# Patient Record
Sex: Female | Born: 1960
Health system: Southern US, Community
[De-identification: ages and names within clinical notes are randomized; demographics above are authoritative.]

## PROBLEM LIST (undated history)

## (undated) DIAGNOSIS — T7840XA Allergy, unspecified, initial encounter: Secondary | ICD-10-CM

## (undated) DIAGNOSIS — F419 Anxiety disorder, unspecified: Secondary | ICD-10-CM

## (undated) DIAGNOSIS — I10 Essential (primary) hypertension: Secondary | ICD-10-CM

## (undated) DIAGNOSIS — H269 Unspecified cataract: Secondary | ICD-10-CM

## (undated) HISTORY — DX: Allergy, unspecified, initial encounter: T78.40XA

## (undated) HISTORY — DX: Unspecified cataract: H26.9

## (undated) HISTORY — DX: Essential (primary) hypertension: I10

## (undated) HISTORY — PX: KIDNEY SURGERY: SHX687

## (undated) HISTORY — PX: BREAST CYST EXCISION: SHX579

## (undated) HISTORY — PX: TUBAL LIGATION: SHX77

## (undated) HISTORY — PX: BREAST EXCISIONAL BIOPSY: SUR124

## (undated) HISTORY — PX: CORNEAL TRANSPLANT: SHX108

---

## 1999-05-30 ENCOUNTER — Emergency Department (HOSPITAL_COMMUNITY): Admission: EM | Admit: 1999-05-30 | Discharge: 1999-05-30 | Payer: Self-pay | Admitting: Internal Medicine

## 1999-05-30 ENCOUNTER — Encounter: Payer: Self-pay | Admitting: Internal Medicine

## 2003-05-07 ENCOUNTER — Emergency Department (HOSPITAL_COMMUNITY): Admission: EM | Admit: 2003-05-07 | Discharge: 2003-05-07 | Payer: Self-pay | Admitting: Emergency Medicine

## 2003-08-19 ENCOUNTER — Encounter: Admission: RE | Admit: 2003-08-19 | Discharge: 2003-08-19 | Payer: Self-pay | Admitting: Family Medicine

## 2004-02-12 ENCOUNTER — Emergency Department (HOSPITAL_COMMUNITY): Admission: EM | Admit: 2004-02-12 | Discharge: 2004-02-12 | Payer: Self-pay | Admitting: Emergency Medicine

## 2004-12-29 ENCOUNTER — Encounter: Admission: RE | Admit: 2004-12-29 | Discharge: 2004-12-29 | Payer: Self-pay | Admitting: Internal Medicine

## 2005-01-10 ENCOUNTER — Encounter (INDEPENDENT_AMBULATORY_CARE_PROVIDER_SITE_OTHER): Payer: Self-pay | Admitting: Specialist

## 2005-01-10 ENCOUNTER — Ambulatory Visit (HOSPITAL_BASED_OUTPATIENT_CLINIC_OR_DEPARTMENT_OTHER): Admission: RE | Admit: 2005-01-10 | Discharge: 2005-01-10 | Payer: Self-pay | Admitting: General Surgery

## 2005-01-10 ENCOUNTER — Ambulatory Visit (HOSPITAL_COMMUNITY): Admission: RE | Admit: 2005-01-10 | Discharge: 2005-01-10 | Payer: Self-pay | Admitting: General Surgery

## 2007-01-19 ENCOUNTER — Emergency Department (HOSPITAL_COMMUNITY): Admission: EM | Admit: 2007-01-19 | Discharge: 2007-01-19 | Payer: Self-pay | Admitting: Emergency Medicine

## 2007-01-22 ENCOUNTER — Emergency Department (HOSPITAL_COMMUNITY): Admission: EM | Admit: 2007-01-22 | Discharge: 2007-01-22 | Payer: Self-pay | Admitting: Emergency Medicine

## 2008-06-14 ENCOUNTER — Emergency Department (HOSPITAL_COMMUNITY): Admission: EM | Admit: 2008-06-14 | Discharge: 2008-06-14 | Payer: Self-pay | Admitting: Emergency Medicine

## 2009-10-04 ENCOUNTER — Emergency Department (HOSPITAL_COMMUNITY): Admission: EM | Admit: 2009-10-04 | Discharge: 2009-10-04 | Payer: Self-pay | Admitting: Emergency Medicine

## 2010-07-31 LAB — URINALYSIS, ROUTINE W REFLEX MICROSCOPIC
Bilirubin Urine: NEGATIVE
Glucose, UA: NEGATIVE mg/dL
Ketones, ur: NEGATIVE mg/dL
Nitrite: NEGATIVE
Protein, ur: 30 mg/dL — AB
Specific Gravity, Urine: 1.03 (ref 1.005–1.030)
Urobilinogen, UA: 0.2 mg/dL (ref 0.0–1.0)
pH: 5.5 (ref 5.0–8.0)

## 2010-07-31 LAB — URINE MICROSCOPIC-ADD ON

## 2010-07-31 LAB — WET PREP, GENITAL: Yeast Wet Prep HPF POC: NONE SEEN

## 2010-07-31 LAB — POCT PREGNANCY, URINE: Preg Test, Ur: NEGATIVE

## 2010-07-31 LAB — GC/CHLAMYDIA PROBE AMP, GENITAL: Chlamydia, DNA Probe: NEGATIVE

## 2010-08-29 LAB — URINALYSIS, ROUTINE W REFLEX MICROSCOPIC
Glucose, UA: NEGATIVE mg/dL
pH: 5.5 (ref 5.0–8.0)

## 2010-08-29 LAB — COMPREHENSIVE METABOLIC PANEL
ALT: 30 U/L (ref 0–35)
Alkaline Phosphatase: 66 U/L (ref 39–117)
Calcium: 9.4 mg/dL (ref 8.4–10.5)
Chloride: 102 mEq/L (ref 96–112)
Creatinine, Ser: 0.95 mg/dL (ref 0.4–1.2)
GFR calc Af Amer: >60 mL/min (ref >60)
Potassium: 3.9 mEq/L (ref 3.5–5.1)
Total Protein: 7.4 g/dL (ref 6.0–8.3)

## 2010-08-29 LAB — CBC
HCT: 29.7 % — ABNORMAL LOW (ref 36.0–46.0)
MCV: 73.2 fL — ABNORMAL LOW (ref 78.0–100.0)
Platelets: 502 10*3/uL — ABNORMAL HIGH (ref 150–400)
RBC: 4.06 MIL/uL (ref 3.87–5.11)

## 2010-08-29 LAB — DIFFERENTIAL
Eosinophils Absolute: 0.1 10*3/uL (ref 0.0–0.7)
Monocytes Absolute: 0.6 10*3/uL (ref 0.1–1.0)

## 2010-08-29 LAB — HEMOCCULT GUIAC POC 1CARD (OFFICE): Fecal Occult Bld: NEGATIVE

## 2010-09-29 NOTE — Op Note (Signed)
NAMECHRISTOL, Candace Hall            ACCOUNT NO.:  [id number]   MEDICAL RECORD NO.:  [id number]          PATIENT TYPE:  AMB   LOCATION:  DSC                          FACILITY:  [facility]   PHYSICIAN:  Ollen Gross. Vernell Morgans, M.D. DATE OF BIRTH:  December 18, 1960   DATE OF PROCEDURE:  01/10/2005  DATE OF DISCHARGE:                                 OPERATIVE REPORT   PREOPERATIVE DIAGNOSIS:  Right breast sebaceous cyst.   POSTOPERATIVE DIAGNOSIS:  Right breast sebaceous cyst.   PROCEDURES:  Excision of right breast sebaceous cyst.   SURGEON:  Ollen Gross. Carolynne Edouard, M.D.   ANESTHESIA:  General via LMA.   PROCEDURE:  After informed consent was obtained, the patient brought to the  operating room and placed in supine position on the table. After induction  of general anesthesia, the patient's right breast was prepped with Betadine  and draped in usual sterile manner. An elliptical incision was made with a  15 blade knife around the mass in question. This incision was carried down  through the skin and subcutaneous tissue sharply with the 15 blade knife  until the mass itself was completely excised. The mass was then sent to  pathology for further evaluation. Hemostasis was achieved using Bovie  electrocautery. The wound was infiltrated with 0.25% Marcaine. The incision  was then closed with interrupted 4-0 Monocryl subcuticular stitches.  Benzoin, Steri-Strips and sterile dressings were applied. The patient  tolerated the procedure well and at the end of the case, all needle, sponge  and instrument counts correct. The patient was awake and taken recovery in  stable condition.      Ollen Gross. Vernell Morgans, M.D.  Electronically Signed     PST/MEDQ  D:  01/10/2005  T:  01/11/2005  Job:  [id number]

## 2011-02-23 LAB — URINALYSIS, ROUTINE W REFLEX MICROSCOPIC
Glucose, UA: NEGATIVE
Nitrite: NEGATIVE

## 2011-02-23 LAB — PREGNANCY, URINE: Preg Test, Ur: NEGATIVE

## 2012-05-11 ENCOUNTER — Encounter (HOSPITAL_COMMUNITY): Payer: Self-pay | Admitting: Emergency Medicine

## 2012-05-11 ENCOUNTER — Emergency Department (HOSPITAL_COMMUNITY)
Admission: EM | Admit: 2012-05-11 | Discharge: 2012-05-11 | Disposition: A | Discharge status: Home / Self Care | Payer: 59 | Attending: Emergency Medicine | Admitting: Emergency Medicine

## 2012-05-11 ENCOUNTER — Emergency Department (HOSPITAL_COMMUNITY): Payer: 59

## 2012-05-11 DIAGNOSIS — M719 Bursopathy, unspecified: Secondary | ICD-10-CM | POA: Insufficient documentation

## 2012-05-11 DIAGNOSIS — M67919 Unspecified disorder of synovium and tendon, unspecified shoulder: Secondary | ICD-10-CM | POA: Insufficient documentation

## 2012-05-11 DIAGNOSIS — M7580 Other shoulder lesions, unspecified shoulder: Secondary | ICD-10-CM

## 2012-05-11 DIAGNOSIS — M2550 Pain in unspecified joint: Secondary | ICD-10-CM | POA: Insufficient documentation

## 2012-05-11 DIAGNOSIS — R0602 Shortness of breath: Secondary | ICD-10-CM | POA: Insufficient documentation

## 2012-05-11 DIAGNOSIS — R0789 Other chest pain: Secondary | ICD-10-CM | POA: Insufficient documentation

## 2012-05-11 DIAGNOSIS — M25519 Pain in unspecified shoulder: Secondary | ICD-10-CM | POA: Insufficient documentation

## 2012-05-11 DIAGNOSIS — M25512 Pain in left shoulder: Secondary | ICD-10-CM

## 2012-05-11 LAB — CBC WITH DIFFERENTIAL/PLATELET
Eosinophils Absolute: 0.1 10*3/uL (ref 0.0–0.7)
Eosinophils Relative: 2 % (ref 0–5)
Hemoglobin: 11.2 g/dL — ABNORMAL LOW (ref 12.0–15.0)
Lymphocytes Relative: 40 % (ref 12–46)
MCH: 24.4 pg — ABNORMAL LOW (ref 26.0–34.0)
MCHC: 32.7 g/dL (ref 30.0–36.0)
Monocytes Absolute: 0.5 10*3/uL (ref 0.1–1.0)
RBC: 4.59 MIL/uL (ref 3.87–5.11)
RDW: 16.3 % — ABNORMAL HIGH (ref 11.5–15.5)
WBC: 7.1 10*3/uL (ref 4.0–10.5)

## 2012-05-11 LAB — BASIC METABOLIC PANEL
Creatinine, Ser: 0.93 mg/dL (ref 0.50–1.10)
GFR calc Af Amer: 82 mL/min — ABNORMAL LOW (ref >90)
GFR calc non Af Amer: 70 mL/min — ABNORMAL LOW (ref >90)
Potassium: 3.7 mEq/L (ref 3.5–5.1)

## 2012-05-11 MED ORDER — TRAMADOL HCL 50 MG PO TABS
50.0000 mg | ORAL_TABLET | Freq: Once | ORAL | Status: DC
  Filled 2012-05-11: qty 1

## 2012-05-11 MED ORDER — IBUPROFEN 200 MG PO TABS
ORAL_TABLET | ORAL | Status: AC
  Administered 2012-05-11: 200 mg
  Filled 2012-05-11: qty 1

## 2012-05-11 MED ORDER — TRAMADOL HCL 50 MG PO TABS: 50.0000 mg | ORAL_TABLET | Freq: Four times a day (QID) | ORAL | Status: DC | PRN

## 2012-05-11 MED ORDER — IBUPROFEN 800 MG PO TABS
800.0000 mg | ORAL_TABLET | Freq: Once | ORAL | Status: DC
  Filled 2012-05-11: qty 1

## 2012-05-11 MED ORDER — IBUPROFEN 400 MG PO TABS: 200.0000 mg | ORAL_TABLET | Freq: Four times a day (QID) | ORAL | Status: DC | PRN

## 2012-05-11 NOTE — ED Notes (Signed)
Patient transported to X-ray 

## 2012-05-11 NOTE — ED Provider Notes (Signed)
Medical screening examination/treatment/procedure(s) were performed by non-physician practitioner and as supervising physician I was immediately available for consultation/collaboration.   Charles B. Bernette Mayers, MD 05/11/12 1517

## 2012-05-11 NOTE — ED Notes (Signed)
Patent states that she has pain to her left shoulder up into the left side of her neck. The patient also reports that she has HA - the patient is HTN in triage with no history of such.

## 2012-05-11 NOTE — ED Provider Notes (Signed)
History     CSN: [id number]  Arrival date & time 05/11/12  1226   First MD Initiated Contact with Patient 05/11/12 1253      Chief Complaint  Patient presents with  . Headache  . Shoulder Pain    (Consider location/radiation/quality/duration/timing/severity/associated sxs/prior treatment) HPI Comments: Patient presents with left shoulder pain and neck pain since Tuesday. Associated symptoms include left chest pain and SOB. Of note patient has a history of untreated anxiety and frequent panic attacks. Last attach was 2 weeks ago.  Denies fever or chills. Denies NVD or abdominal pain. Denies injury or trauma.   The history is provided by the patient. No language interpreter was used.    History reviewed. No pertinent past medical history.  Past Surgical History  Procedure Date  . Kidney surgery     No family history on file.  History  Substance Use Topics  . Smoking status: Never Smoker   . Smokeless tobacco: Not on file  . Alcohol Use: No    OB History    Grav Para Term Preterm Abortions TAB SAB Ect Mult Living                  Review of Systems  Constitutional: Negative for fever and chills.  Respiratory: Positive for chest tightness and shortness of breath.   Gastrointestinal: Negative for nausea, vomiting, abdominal pain and diarrhea.  Musculoskeletal: Positive for arthralgias.    Allergies  Penicillins  Home Medications   Current Outpatient Rx  Name  Route  Sig  Dispense  Refill  . BC HEADACHE POWDER PO   Oral   Take 1 packet by mouth every 6 (six) hours as needed. For pain           BP 165/95  Pulse 94  Temp 98.2 F (36.8 C) (Oral)  Resp 20  SpO2 98%  LMP 04/20/2012  Physical Exam  Nursing note and vitals reviewed. Constitutional: She appears well-developed and well-nourished.  HENT:  Head: Normocephalic and atraumatic.  Mouth/Throat: Oropharynx is clear and moist.  Eyes: Conjunctivae normal and EOM are normal. No scleral icterus.    Neck: Normal range of motion. Neck supple.  Cardiovascular: Normal rate, regular rhythm and normal heart sounds.   Pulmonary/Chest: Effort normal and breath sounds normal.  Abdominal: Soft. Bowel sounds are normal. There is no tenderness.  Musculoskeletal:       Arms: Neurological: She is alert.  Skin: Skin is warm.    ED Course  Procedures (including critical care time) Results for orders placed during the hospital encounter of 05/11/12  CBC WITH DIFFERENTIAL      Component Value Range   WBC 7.1  4.0 - 10.5 K/uL   RBC 4.59  3.87 - 5.11 MIL/uL   Hemoglobin 11.2 (*) 12.0 - 15.0 g/dL   HCT 91.4 (*) 78.2 - 95.6 %   MCV 74.5 (*) 78.0 - 100.0 fL   MCH 24.4 (*) 26.0 - 34.0 pg   MCHC 32.7  30.0 - 36.0 g/dL   RDW 21.3 (*) 08.6 - 57.8 %   Platelets 383  150 - 400 K/uL   Neutrophils Relative 51  43 - 77 %   Neutro Abs 3.6  1.7 - 7.7 K/uL   Lymphocytes Relative 40  12 - 46 %   Lymphs Abs 2.9  0.7 - 4.0 K/uL   Monocytes Relative 7  3 - 12 %   Monocytes Absolute 0.5  0.1 - 1.0 K/uL   Eosinophils Relative 2  0 - 5 %   Eosinophils Absolute 0.1  0.0 - 0.7 K/uL   Basophils Relative 0  0 - 1 %   Basophils Absolute 0.0  0.0 - 0.1 K/uL  BASIC METABOLIC PANEL      Component Value Range   Sodium 138  135 - 145 mEq/L   Potassium 3.7  3.5 - 5.1 mEq/L   Chloride 106  96 - 112 mEq/L   CO2 26  19 - 32 mEq/L   Glucose, Bld 117 (*) 70 - 99 mg/dL   BUN 10  6 - 23 mg/dL   Creatinine, Ser 1.61  0.50 - 1.10 mg/dL   Calcium 9.0  8.4 - 09.6 mg/dL   GFR calc non Af Amer 70 (*) >90 mL/min   GFR calc Af Amer 82 (*) >90 mL/min  TROPONIN I      Component Value Range   Troponin I <0.30  <0.30 ng/mL    Labs Reviewed - No data to display Dg Chest 2 View  05/11/2012  *RADIOLOGY REPORT*  Clinical Data: Headache and shoulder pain.  CHEST - 2 VIEW  Comparison: None.  Findings: Cardiomediastinal silhouette is within normal limits. The lungs are free of focal consolidations and pleural effusions. There are  degenerative changes in the mid thoracic spine.  Surgical clips are present in the left upper quadrant of the abdomen.  IMPRESSION: No evidence for acute cardiopulmonary abnormality.   Original Report Authenticated By: Norva Pavlov, M.D.    Dg Shoulder Left  05/11/2012  *RADIOLOGY REPORT*  Clinical Data: Headache and shoulder pain.  No known injury. Symptoms for 5 days.  LEFT SHOULDER - 2+ VIEW  Comparison: Chest x-ray 01/19/2007  Findings: There is no evidence for acute fracture or dislocation. No soft tissue foreign body or gas identified.  Left lung apex is unremarkable in appearance.  IMPRESSION: Negative exam.   Original Report Authenticated By: Norva Pavlov, M.D.    Date: 05/11/2012  Rate: 62  Rhythm: normal sinus rhythm  QRS Axis: normal  Intervals: normal  ST/T Wave abnormalities: normal  Conduction Disutrbances:none  Narrative Interpretation: No STEMI  Old EKG Reviewed: none available     1. Rotator cuff tendinitis   2. Left shoulder pain       MDM  Patient presented with left shoulder pain, chest tightness and SOB. EKG, CXR and troponin: unremarkable. Labs unremarkable. Shoulder imaging unremarkable.  Patient did not want pain medication. Became anxious. Given history and physical high suspicion for rotator cuff tendinitis. Referral to primary care given to address possible HTN. Patient instructed to establish primary care and have blood pressure recheck in 3 weeks. Also referred to physical therapy. Given Rx for Ibuprofen and tramadol. Return precautions given. No red flags for septic arthritis.        Pixie Casino, PA-C 05/11/12 1512

## 2012-05-11 NOTE — ED Notes (Signed)
Reports woke up Tuesday morning with left shoulder hurting, denies any trauma rated 10/10 now 8/10 certain  movements increase pain in neck. Also, woke up this am with a headache/in face/jaw/ teeth are aching due to headache. Patient has a headache everyday for 4-6 months. Treats headaches with BC powder last dose was Tues when she woke up.

## 2013-06-28 ENCOUNTER — Encounter (HOSPITAL_COMMUNITY): Payer: Self-pay | Admitting: Emergency Medicine

## 2013-06-28 ENCOUNTER — Emergency Department (HOSPITAL_COMMUNITY): Payer: 59

## 2013-06-28 ENCOUNTER — Emergency Department (HOSPITAL_COMMUNITY)
Admission: EM | Admit: 2013-06-28 | Discharge: 2013-06-28 | Disposition: A | Discharge status: Home / Self Care | Payer: 59 | Attending: Emergency Medicine | Admitting: Emergency Medicine

## 2013-06-28 DIAGNOSIS — R072 Precordial pain: Secondary | ICD-10-CM | POA: Insufficient documentation

## 2013-06-28 DIAGNOSIS — R0602 Shortness of breath: Secondary | ICD-10-CM | POA: Insufficient documentation

## 2013-06-28 DIAGNOSIS — Z8659 Personal history of other mental and behavioral disorders: Secondary | ICD-10-CM | POA: Insufficient documentation

## 2013-06-28 DIAGNOSIS — Z880 Allergy status to penicillin: Secondary | ICD-10-CM | POA: Insufficient documentation

## 2013-06-28 DIAGNOSIS — M549 Dorsalgia, unspecified: Secondary | ICD-10-CM | POA: Insufficient documentation

## 2013-06-28 DIAGNOSIS — R079 Chest pain, unspecified: Secondary | ICD-10-CM

## 2013-06-28 HISTORY — DX: Anxiety disorder, unspecified: F41.9

## 2013-06-28 LAB — CBC WITH DIFFERENTIAL/PLATELET
BASOS PCT: 0 % (ref 0–1)
Basophils Absolute: 0 10*3/uL (ref 0.0–0.1)
EOS PCT: 1 % (ref 0–5)
Eosinophils Absolute: 0.1 10*3/uL (ref 0.0–0.7)
HEMATOCRIT: 33.5 % — AB (ref 36.0–46.0)
HEMOGLOBIN: 11.7 g/dL — AB (ref 12.0–15.0)
LYMPHS PCT: 35 % (ref 12–46)
Lymphs Abs: 2.7 10*3/uL (ref 0.7–4.0)
MCH: 29.8 pg (ref 26.0–34.0)
MCHC: 34.9 g/dL (ref 30.0–36.0)
MCV: 85.5 fL (ref 78.0–100.0)
MONO ABS: 0.5 10*3/uL (ref 0.1–1.0)
MONOS PCT: 7 % (ref 3–12)
NEUTROS ABS: 4.3 10*3/uL (ref 1.7–7.7)
Neutrophils Relative %: 56 % (ref 43–77)
Platelets: 316 10*3/uL (ref 150–400)
RBC: 3.92 MIL/uL (ref 3.87–5.11)
RDW: 13.8 % (ref 11.5–15.5)
WBC: 7.6 10*3/uL (ref 4.0–10.5)

## 2013-06-28 LAB — POCT I-STAT, CHEM 8
BUN: 10 mg/dL (ref 6–23)
CHLORIDE: 109 meq/L (ref 96–112)
CREATININE: 0.9 mg/dL (ref 0.50–1.10)
Calcium, Ion: 1.25 mmol/L — ABNORMAL HIGH (ref 1.12–1.23)
GLUCOSE: 94 mg/dL (ref 70–99)
HCT: 34 % — ABNORMAL LOW (ref 36.0–46.0)
Hemoglobin: 11.6 g/dL — ABNORMAL LOW (ref 12.0–15.0)
POTASSIUM: 4 meq/L (ref 3.7–5.3)
Sodium: 140 mEq/L (ref 137–147)
TCO2: 23 mmol/L (ref 0–100)

## 2013-06-28 LAB — D-DIMER, QUANTITATIVE: D-Dimer, Quant: <0.27 ug/mL-FEU (ref 0.00–0.48)

## 2013-06-28 LAB — POCT I-STAT TROPONIN I: Troponin i, poc: 0 ng/mL (ref 0.00–0.08)

## 2013-06-28 LAB — TROPONIN I

## 2013-06-28 MED ORDER — ACETAMINOPHEN 325 MG PO TABS
650.0000 mg | ORAL_TABLET | Freq: Once | ORAL | Status: AC
  Administered 2013-06-28: 650 mg via ORAL
  Filled 2013-06-28: qty 2

## 2013-06-28 MED ORDER — OXYCODONE-ACETAMINOPHEN 5-325 MG PO TABS
1.0000 | ORAL_TABLET | Freq: Once | ORAL | Status: DC
  Filled 2013-06-28: qty 1

## 2013-06-28 MED ORDER — TRAMADOL HCL 50 MG PO TABS: 50.0000 mg | ORAL_TABLET | Freq: Four times a day (QID) | ORAL | Status: DC | PRN

## 2013-06-28 NOTE — Discharge Instructions (Signed)
Back Pain, Adult Low back pain is very common. About 1 in 5 people have back pain.The cause of low back pain is rarely dangerous. The pain often gets better over time.About half of people with a sudden onset of back pain feel better in just 2 weeks. About 8 in 10 people feel better by 6 weeks.  CAUSES Some common causes of back pain include:  Strain of the muscles or ligaments supporting the spine.  Wear and tear (degeneration) of the spinal discs.  Arthritis.  Direct injury to the back. DIAGNOSIS Most of the time, the direct cause of low back pain is not known.However, back pain can be treated effectively even when the exact cause of the pain is unknown.Answering your caregiver's questions about your overall health and symptoms is one of the most accurate ways to make sure the cause of your pain is not dangerous. If your caregiver needs more information, he or she may order lab work or imaging tests (X-rays or MRIs).However, even if imaging tests show changes in your back, this usually does not require surgery. HOME CARE INSTRUCTIONS For many people, back pain returns.Since low back pain is rarely dangerous, it is often a condition that people can learn to [facility] their own.   Remain active. It is stressful on the back to sit or stand in one place. Do not sit, drive, or stand in one place for more than 30 minutes at a time. Take short walks on level surfaces as soon as pain allows.Try to increase the length of time you walk each day.  Do not stay in bed.Resting more than 1 or 2 days can delay your recovery.  Do not avoid exercise or work.Your body is made to move.It is not dangerous to be active, even though your back may hurt.Your back will likely heal faster if you return to being active before your pain is gone.  Pay attention to your body when you bend and lift. Many people have less discomfortwhen lifting if they bend their knees, keep the load close to their bodies,and  avoid twisting. Often, the most comfortable positions are those that put less stress on your recovering back.  Find a comfortable position to sleep. Use a firm mattress and lie on your side with your knees slightly bent. If you lie on your back, put a pillow under your knees.  Only take over-the-counter or prescription medicines as directed by your caregiver. Over-the-counter medicines to reduce pain and inflammation are often the most helpful.Your caregiver may prescribe muscle relaxant drugs.These medicines help dull your pain so you can more quickly return to your normal activities and healthy exercise.  Put ice on the injured area.  Put ice in a plastic bag.  Place a towel between your skin and the bag.  Leave the ice on for 15-20 minutes, 03-04 times a day for the first 2 to 3 days. After that, ice and heat may be alternated to reduce pain and spasms.  Ask your caregiver about trying back exercises and gentle massage. This may be of some benefit.  Avoid feeling anxious or stressed.Stress increases muscle tension and can worsen back pain.It is important to recognize when you are anxious or stressed and learn ways to manage it.Exercise is a great option. SEEK MEDICAL CARE IF:  You have pain that is not relieved with rest or medicine.  You have pain that does not improve in 1 week.  You have new symptoms.  You are generally not feeling well. SEEK  IMMEDIATE MEDICAL CARE IF:   You have pain that radiates from your back into your legs.  You develop new bowel or bladder control problems.  You have unusual weakness or numbness in your arms or legs.  You develop nausea or vomiting.  You develop abdominal pain.  You feel faint. Document Released: 04/30/2005 Document Revised: 10/30/2011 Document Reviewed: 09/18/2010 [facility] Patient Information 2014 [place], [place].  Emergency Department Resource Guide 1) Find a Doctor and Pay Out of Pocket Although you won't have to find  out who is covered by your insurance plan, it is a good idea to ask around and get recommendations. You will then need to call the office and see if the doctor you have chosen will accept you as a new patient and what types of options they offer for patients who are self-pay. Some doctors offer discounts or will set up payment plans for their patients who do not have insurance, but you will need to ask so you aren't surprised when you get to your appointment.  2) Contact Your Local Health Department Not all health departments have doctors that can see patients for sick visits, but many do, so it is worth a call to see if yours does. If you don't know where your local health department is, you can check in your phone book. The CDC also has a tool to help you locate your state's health department, and many state websites also have listings of all of their local health departments.  3) Find a [facility] If your illness is not likely to be very severe or complicated, you may want to try a walk in clinic. These are popping up all over the country in pharmacies, drugstores, and shopping centers. They're usually staffed by nurse practitioners or physician assistants that have been trained to treat common illnesses and complaints. They're usually fairly quick and inexpensive. However, if you have serious medical issues or chronic medical problems, these are probably not your best option.  No Primary Care Doctor: - Call Health Connect at  [phone] - they can help you locate a primary care doctor that  accepts your insurance, provides certain services, etc. - Physician Referral Service- [phone]  Chronic Pain Problems: Organization         Address  Phone   Notes  [facility]  [phone] Patients need to be referred by their primary care doctor.   Medication Assistance: Organization         Address  Phone   Notes  [facility] [address] [place], [place] [address] [phone] --Must be a resident of [facility] -- Must have NO insurance coverage whatsoever (no Medicaid/ Medicare, etc.) -- The pt. MUST have a primary care doctor that directs their care regularly and follows them in the community   [facility]ist  [phone]   [employer]  7470069707    Agencies that provide inexpensive medical care: Organization         Address  Phone   Notes  [place]  [phone]   [facility]    [phone]   [facility] [place], [place] [address] [phone]   [place] [phone]   Planned Parenthood    [phone]   [facility]    [phone]   Community Health and [facility]  [place] [address], [place] Phone:  [phone], Fax:  [phone] Hours of Operation:  9 am - 6 pm, M-F.  Also accepts Medicaid/Medicare and self-pay.  [facility] for [place] [address], [address], [place] Phone: [phone], Fax: [phone]. Hours of Operation:  8:30 am - 5:30 pm, M-F.  Also accepts Medicaid and self-pay.  [facility] ([facility]) [place] [address], [place] Phone: [phone]   [place], [place], [place] [phone], Ext. [phone] Mondays & Thursdays: 7-9 AM.  First 15 patients are seen on a first come, first serve basis.    [place] Providers:  Organization         Address  Phone   Notes  [facility] [address], [place] [phone] Also accepts self-pay patients.  [facility] [id number] [place], [place]  [phone]   [place], [address], [place] [phone]   [facility] [address] [place] [phone]   Lucianne Lei  [address], [place]   [phone] Only accepts [place] Access [place] patients after they have their name applied to their card.   Self-Pay (no insurance) in [facility]:  Organization         Address  Phone   Notes  Sickle Cell Patients, [facility] [place] [phone]   [facility] Urgent Care [place] [phone]   [facility] [place]  [place], [place], [place] [phone]   [facility]/Dr. Osei-Bonsu  [address], [place] or [address], [place] [phone] Phone number for both [place] and [place] locations is the same.  Urgent Medical and [facility] [address], [place] [phone]   [facility] [address] [place] or [address] [phone]   [facility] [address], [place] [phone], phone; [phone], fax Sees patients 1st and 3rd Saturday of every month.  Must not qualify for public or private insurance (i.e. Medicaid, Medicare, [place] Health Choice, Veterans' Benefits)  Household income should be no more than 200% of the poverty level The clinic cannot treat you if you are pregnant or think you are pregnant  Sexually transmitted diseases are not treated at the clinic.    Dental Care: Organization         Address  Phone  Notes  [facility] Department of [facility] [place] [phone] Accepts children up to age 60 who are enrolled in [place] or [place]; pregnant women with a Medicaid card; and children who have applied for Medicaid or [place] Health Choice, but were declined, whose parents can pay a reduced fee at time of service.  [facility] Department of [facility]  [address], [place] [phone] Accepts children up to age 53  who are enrolled in [place] or [place]; pregnant women with a Medicaid card; and children who have applied for Medicaid or [place] Health Choice, but were declined, whose parents can pay a reduced fee at time of service.  [facility] Adult Dental Access PROGRAM  [place], [place] [phone]  Patients are seen by appointment only. Walk-ins are not accepted. [place] will see patients 90 years of age and older. Monday - Tuesday (8am-5pm) Most Wednesdays (8:30-5pm) $30 per visit, cash only  [facility] Adult Dental Access PROGRAM  [address], [facility] [phone] Patients are seen by appointment only. Walk-ins are not accepted. [place] will see patients 107 years of age and older. One Wednesday Evening (Monthly: Volunteer Based).  $30 per visit, cash only  [place]  [phone] for adults; Children under age 106, call Graduate Pediatric Dentistry at [phone]. Children aged 84-14, please call [phone] to request a pediatric application.  Dental services are provided in all areas of dental care including fillings, crowns and bridges, complete and partial dentures, implants, gum treatment, root canals, and extractions. Preventive care is also provided. Treatment is provided to both adults and children. Patients are selected via a lottery and there is often a waiting list.   [facility] [address], [place]  [phone] [email or web address]   [facility] [address] [place], [place] [phone], Ext. [phone] Second and Fourth Thursday of each month, opens at 6:30 AM; Clinic ends at 9 AM.  Patients are seen on a first-come first-served basis, and a limited number are seen during each clinic.   [facility]  [address] [place]ard Danker [place], [place] [phone]   Eligibility Requirements You must have lived in [place], [place], or [place] counties for at least the last three months.    You cannot be eligible for state or federal sponsored [employer], including [employer], [place], or [employer].   You generally cannot be eligible for healthcare insurance through your employer.    How to apply: Eligibility screenings are held every Tuesday and Wednesday afternoon from 1:00 pm until 4:00 pm. You do not need an appointment for the interview!  [facility] [address], [place], [place]   [place] Department  [place]  [phone]    Behavioral Health Resources in the Community: Intensive Outpatient Programs Organization         Address  Phone  Notes  [place]. [address] [place], [place] [phone]   [facility] Outpatient [address] [place], [place]   ADS: Alcohol & Drug Svcs [address], [place], [place]   [place] [address]  [employer], [place] or [phone]   Substance Abuse Resources Organization         Address  Phone  Notes  Alcohol and Drug Services  [phone]   [place]  7246752435   [facility]   Chinita Pester  [phone]   Residential & Outpatient Substance Abuse Program  [phone]   Psychological Services Organization         Address  Phone  Notes  [facility] [place]  [place]  [phone]   [place] [address] [place] or [phone]    Mobile Crisis Teams Organization         Address  Phone  Notes  [facility], Mobile Crisis Care Unit  [phone]   [facility]ive Psychotherapeutic Services  [address] [place], [place]   [facility] [address], [place] 18  [place] [phone]    Self-Help/Support  Groups Organization         Address  Phone             Notes  Mental Health Assoc. of [place] - variety of support groups  [place] Call for more information  Narcotics Anonymous (NA), [facility] [address], [employer]   2 meetings at this location   Special educational needs teacher         Address  Phone  Notes  [facility] Residential Treatment [place],    [place]  [phone]   [facility] Med  [address] [place] [address], [place], [place]   [place], [place] [phone] Admissions: 8am-3pm M-F  Incentives Substance [place] [address]    [place], [place] [phone]   [facility] [address] [place], [place], [place]   [facility] [address]  [place], [place]   [facility] Programs - Intensive Outpatient [address], [place], [place]   [facility] ([place] [place].,  [place], [place] [phone] or [phone]   [facility] Services (RTS) [address] [place], [place] Accepts Medicaid  [facility] [place] [address],  [place] [phone] Substance Abuse/Addiction Treatment   [facility] Organization         Address  Phone  Notes  [facility]  [phone]   Domenic Schwab, PhD [address] Arlis [place]a [place], [place]   [phone] or [phone]   [place], [place] [phone]   [facility] [address] [place], [place] [phone] Insurance/Medicaid/sponsorship through [facility] and Families [address] [place]                                    [place], [place] [phone] [place] [address], [place] [phone]    Dr. Adele Schilder  [phone]   [facility] [place] Dept. 1) [address], [place] 2) [place] 3)  [address], [place] [phone]  [phone]   [place] [phone] or [phone] (After Hours)

## 2013-06-28 NOTE — ED Notes (Addendum)
Pt reports to the ED via [facility] for eval of sharp mid sternal chest pain. Pt reports she had just finished washing dishes and was walking to the bedroom when she developed sharp pain inferior to her left shoulder blade as well as sharp mid sternal chest pain with deep breaths. Pt reports if she breaths normally her chest does not hurt. associated symptoms include SOB and lightheadedness. 12 lead NSR. Pt received 324 of ASA pta but refused nitro. Pt hypertensive en route. CBG 90 mg/dl. Pt denies any known injury. Pt A&Ox4, resp e/u, and skin warm and dry.

## 2013-06-28 NOTE — ED Notes (Signed)
Candace Hall (husband)- [phone] - is going to run some errands. Want to be called for any changes.

## 2013-06-28 NOTE — ED Provider Notes (Signed)
CSN: [id number]     Arrival date & time 06/28/13  1111 History   First MD Initiated Contact with Patient 06/28/13 1115     Chief Complaint  Patient presents with  . Chest Pain     (Consider location/radiation/quality/duration/timing/severity/associated sxs/prior Treatment) HPI Comments: Pt states that she had acute onset of shoulder blade pain that started about 1 hour ago. Pt state that the pain then started in her left chest when she takes a deep breathe and the pain is sharp in nature. Denies fever, n/v, diaphoresis. Pt states that she does have history of anxiety, but this feels different. Pt states that the pain is decreasing. Deep breathing makes the pain worse and she feels sob with the episodes. Pt has had a cough recently. Denies any medical problems  The history is provided by the patient. No language interpreter was used.    Past Medical History  Diagnosis Date  . Anxiety    Past Surgical History  Procedure Laterality Date  . Kidney surgery    . Breast cyst excision    . Corneal transplant     History reviewed. No pertinent family history. History  Substance Use Topics  . Smoking status: Never Smoker   . Smokeless tobacco: Not on file  . Alcohol Use: No   OB History   Grav Para Term Preterm Abortions TAB SAB Ect Mult Living                 Review of Systems  Constitutional: Negative.   Respiratory: Positive for cough and shortness of breath.   Cardiovascular: Positive for chest pain.      Allergies  Penicillins  Home Medications  No current outpatient prescriptions on file. BP 140/86  Pulse 61  Temp(Src) 98.7 F (37.1 C) (Oral)  Resp 10  SpO2 98%  LMP 06/26/2013 Physical Exam  Nursing note and vitals reviewed. Constitutional: She is oriented to person, place, and time. She appears well-developed and well-nourished.  HENT:  Head: Normocephalic and atraumatic.  Eyes: Pupils are equal, round, and reactive to light.  Neck: Neck supple.   Cardiovascular: Normal rate and regular rhythm.   Pulmonary/Chest: Effort normal and breath sounds normal. She exhibits no tenderness.  Abdominal: Soft. Bowel sounds are normal. There is no tenderness.  Musculoskeletal: Normal range of motion.  Non tender to palpation in the shoulder blade  Neurological: She is alert and oriented to person, place, and time.  Skin: Skin is warm and dry.  Psychiatric: She has a normal mood and affect.    ED Course  Procedures (including critical care time) Labs Review Labs Reviewed  CBC WITH DIFFERENTIAL  D-DIMER, QUANTITATIVE   Imaging Review Dg Chest 2 View  06/28/2013   CLINICAL DATA:  Chest pain, worse with inspiration, left shoulder pain  EXAM: CHEST  2 VIEW  COMPARISON:  DG CHEST 2 VIEW dated 05/11/2012  FINDINGS: Grossly unchanged cardiac silhouette and mediastinal contours. No focal parenchymal opacities. No pleural effusion or pneumothorax. There is mild elevation of the right hemidiaphragm. No evidence of edema. Grossly unchanged bones. Multiple surgical clips overlie the left upper abdomen.  IMPRESSION: No acute cardiopulmonary disease.   Electronically Signed   By: Sandi Mariscal M.D.   On: 06/28/2013 11:56    EKG Interpretation    Date/Time:  Sunday June 28 2013 11:14:19 EST Ventricular Rate:  69 PR Interval:  201 QRS Duration: 91 QT Interval:  377 QTC Calculation: 404 R Axis:   82 Text Interpretation:  Sinus  rhythm Borderline T wave abnormalities Confirmed by HORTON  MD, COURTNEY ([id number]) on 06/28/2013 11:19:43 AM            MDM   Final diagnoses:  Chest pain  Back pain    Pt dimer negative and delta trop was negative:pt symptoms resolved with tylenol. Doubt acs. Gave pt ultram as need for pain at home and resource guide to establish care with a pcp    Glendell Docker, NP 06/28/13 1658

## 2013-06-28 NOTE — ED Provider Notes (Signed)
Medical screening examination/treatment/procedure(s) were performed by non-physician practitioner and as supervising physician I was immediately available for consultation/collaboration.  EKG Interpretation    Date/Time:  Sunday June 28 2013 11:14:19 EST Ventricular Rate:  69 PR Interval:  201 QRS Duration: 91 QT Interval:  377 QTC Calculation: 404 R Axis:   82 Text Interpretation:  Sinus rhythm Borderline T wave abnormalities Confirmed by Dina Rich  MD, COURTNEY ([id number]) on 06/28/2013 11:19:43 AM             Merryl Hacker, MD 06/28/13 2238

## 2014-01-22 ENCOUNTER — Emergency Department (HOSPITAL_COMMUNITY): Payer: 59

## 2014-01-22 ENCOUNTER — Encounter (HOSPITAL_COMMUNITY): Payer: Self-pay | Admitting: Emergency Medicine

## 2014-01-22 ENCOUNTER — Emergency Department (HOSPITAL_COMMUNITY)
Admission: EM | Admit: 2014-01-22 | Discharge: 2014-01-22 | Disposition: A | Discharge status: Home / Self Care | Payer: 59 | Attending: Emergency Medicine | Admitting: Emergency Medicine

## 2014-01-22 DIAGNOSIS — R1031 Right lower quadrant pain: Secondary | ICD-10-CM | POA: Insufficient documentation

## 2014-01-22 DIAGNOSIS — Z8659 Personal history of other mental and behavioral disorders: Secondary | ICD-10-CM | POA: Insufficient documentation

## 2014-01-22 DIAGNOSIS — Z3202 Encounter for pregnancy test, result negative: Secondary | ICD-10-CM | POA: Diagnosis not present

## 2014-01-22 DIAGNOSIS — N7011 Chronic salpingitis: Secondary | ICD-10-CM

## 2014-01-22 DIAGNOSIS — N7013 Chronic salpingitis and oophoritis: Secondary | ICD-10-CM | POA: Insufficient documentation

## 2014-01-22 DIAGNOSIS — Z880 Allergy status to penicillin: Secondary | ICD-10-CM | POA: Diagnosis not present

## 2014-01-22 LAB — URINE MICROSCOPIC-ADD ON

## 2014-01-22 LAB — COMPREHENSIVE METABOLIC PANEL
ALT: 38 U/L — ABNORMAL HIGH (ref 0–35)
AST: 25 U/L (ref 0–37)
Albumin: 3.5 g/dL (ref 3.5–5.2)
Alkaline Phosphatase: 118 U/L — ABNORMAL HIGH (ref 39–117)
Anion gap: 13 (ref 5–15)
BILIRUBIN TOTAL: 0.7 mg/dL (ref 0.3–1.2)
BUN: 6 mg/dL (ref 6–23)
CHLORIDE: 98 meq/L (ref 96–112)
CO2: 23 meq/L (ref 19–32)
CREATININE: 0.91 mg/dL (ref 0.50–1.10)
Calcium: 9.7 mg/dL (ref 8.4–10.5)
GFR calc Af Amer: 83 mL/min — ABNORMAL LOW (ref >90)
GFR, EST NON AFRICAN AMERICAN: 72 mL/min — AB (ref >90)
Glucose, Bld: 97 mg/dL (ref 70–99)
Potassium: 4.2 mEq/L (ref 3.7–5.3)
Sodium: 134 mEq/L — ABNORMAL LOW (ref 137–147)
Total Protein: 7.8 g/dL (ref 6.0–8.3)

## 2014-01-22 LAB — CBC WITH DIFFERENTIAL/PLATELET
BASOS ABS: 0 10*3/uL (ref 0.0–0.1)
Basophils Relative: 0 % (ref 0–1)
Eosinophils Absolute: 0.1 10*3/uL (ref 0.0–0.7)
Eosinophils Relative: 1 % (ref 0–5)
HEMATOCRIT: 36.4 % (ref 36.0–46.0)
HEMOGLOBIN: 12.5 g/dL (ref 12.0–15.0)
LYMPHS ABS: 3 10*3/uL (ref 0.7–4.0)
LYMPHS PCT: 23 % (ref 12–46)
MCH: 29.6 pg (ref 26.0–34.0)
MCHC: 34.3 g/dL (ref 30.0–36.0)
MCV: 86.3 fL (ref 78.0–100.0)
MONO ABS: 1.3 10*3/uL — AB (ref 0.1–1.0)
MONOS PCT: 10 % (ref 3–12)
NEUTROS ABS: 8.6 10*3/uL — AB (ref 1.7–7.7)
Neutrophils Relative %: 66 % (ref 43–77)
Platelets: 335 10*3/uL (ref 150–400)
RBC: 4.22 MIL/uL (ref 3.87–5.11)
RDW: 13.6 % (ref 11.5–15.5)
WBC: 12.9 10*3/uL — AB (ref 4.0–10.5)

## 2014-01-22 LAB — WET PREP, GENITAL
TRICH WET PREP: NONE SEEN
YEAST WET PREP: NONE SEEN

## 2014-01-22 LAB — URINALYSIS, ROUTINE W REFLEX MICROSCOPIC
BILIRUBIN URINE: NEGATIVE
Glucose, UA: NEGATIVE mg/dL
Hgb urine dipstick: NEGATIVE
KETONES UR: NEGATIVE mg/dL
NITRITE: NEGATIVE
PH: 5.5 (ref 5.0–8.0)
Protein, ur: NEGATIVE mg/dL
Specific Gravity, Urine: 1.018 (ref 1.005–1.030)
Urobilinogen, UA: 1 mg/dL (ref 0.0–1.0)

## 2014-01-22 LAB — LIPASE, BLOOD: LIPASE: 26 U/L (ref 11–59)

## 2014-01-22 LAB — POC URINE PREG, ED: Preg Test, Ur: NEGATIVE

## 2014-01-22 MED ORDER — IOHEXOL 300 MG/ML  SOLN
100.0000 mL | Freq: Once | INTRAMUSCULAR | Status: AC | PRN
  Administered 2014-01-22: 100 mL via INTRAVENOUS

## 2014-01-22 MED ORDER — SODIUM CHLORIDE 0.9 % IV BOLUS (SEPSIS)
1000.0000 mL | Freq: Once | INTRAVENOUS | Status: AC
  Administered 2014-01-22: 1000 mL via INTRAVENOUS

## 2014-01-22 MED ORDER — IOHEXOL 300 MG/ML  SOLN
50.0000 mL | Freq: Once | INTRAMUSCULAR | Status: AC | PRN
  Administered 2014-01-22: 50 mL via ORAL

## 2014-01-22 MED ORDER — ONDANSETRON HCL 4 MG PO TABS: 4.0000 mg | ORAL_TABLET | Freq: Four times a day (QID) | ORAL | Status: DC

## 2014-01-22 MED ORDER — METRONIDAZOLE 500 MG PO TABS: 500.0000 mg | ORAL_TABLET | Freq: Two times a day (BID) | ORAL | Status: DC

## 2014-01-22 MED ORDER — DOXYCYCLINE HYCLATE 50 MG PO CAPS: 100.0000 mg | ORAL_CAPSULE | Freq: Two times a day (BID) | ORAL | Status: DC

## 2014-01-22 MED ORDER — HYDROCODONE-ACETAMINOPHEN 5-325 MG PO TABS: 1.0000 | ORAL_TABLET | Freq: Four times a day (QID) | ORAL | Status: DC | PRN

## 2014-01-22 NOTE — ED Notes (Signed)
Pt states she has RLQ abdominal pain, radiating to her right leg since Tuesday night and began feeling nauseas yesterday. Pt denies diarrhea/vomiting. Pt A&O in NAD.

## 2014-01-22 NOTE — ED Notes (Signed)
Patient transported to CT 

## 2014-01-22 NOTE — Discharge Instructions (Signed)
There was an abnormality seen on your abdominal CT scan. Please follow up with your PCP for an outpatient CT of your chest.   Pelvic Inflammatory Disease Pelvic inflammatory disease (PID) refers to an infection in some or all of the female organs. The infection can be in the uterus, ovaries, fallopian tubes, or the surrounding tissues in the pelvis. PID can cause abdominal or pelvic pain that comes on suddenly (acute pelvic pain). PID is a serious infection because it can lead to lasting (chronic) pelvic pain or the inability to have children (infertile).  CAUSES  The infection is often caused by the normal bacteria found in the vaginal tissues. PID may also be caused by an infection that is spread during sexual contact. PID can also occur following:   The birth of a baby.   A miscarriage.   An abortion.   Major pelvic surgery.   The use of an intrauterine device (IUD).   A sexual assault.  RISK FACTORS Certain factors can put a person at higher risk for PID, such as:  Being younger than 25 years.  Being sexually active at [place] age.  Usingnonbarrier contraception.  Havingmultiple sexual partners.  Having sex with someone who has symptoms of a genital infection.  Using oral contraception. Other times, certain behaviors can increase the possibility of getting PID, such as:  Having sex during your period.  Using a vaginal douche.  Having an intrauterine device (IUD) in place. SYMPTOMS   Abdominal or pelvic pain.   Fever.   Chills.   Abnormal vaginal discharge.  Abnormal uterine bleeding.   Unusual pain shortly after finishing your period. DIAGNOSIS  Your caregiver will choose some of the following methods to make a diagnosis, such as:   Performinga physical exam and history. A pelvic exam typically reveals a very tender uterus and surrounding pelvis.   Ordering laboratory tests including a pregnancy test, blood tests, and urine  test.  Orderingcultures of the vagina and cervix to check for a sexually transmitted infection (STI).  Performing an ultrasound.   Performing a laparoscopic procedure to look inside the pelvis.  TREATMENT   Antibiotic medicines may be prescribed and taken by mouth.   Sexual partners may be treated when the infection is caused by a sexually transmitted disease (STD).   Hospitalization may be needed to give antibiotics intravenously.  Surgery may be needed, but this is rare. It may take weeks until you are completely well. If you are diagnosed with PID, you should also be checked for human immunodeficiency virus (HIV). HOME CARE INSTRUCTIONS   If given, take your antibiotics as directed. Finish the medicine even if you start to feel better.   Only take over-the-counter or prescription medicines for pain, discomfort, or fever as directed by your caregiver.   Do not have sexual intercourse until treatment is completed or as directed by your caregiver. If PID is confirmed, your recent sexual partner(s) will need treatment.   Keep your follow-up appointments. SEEK MEDICAL CARE IF:   You have increased or abnormal vaginal discharge.   You need prescription medicine for your pain.   You vomit.   You cannot take your medicines.   Your partner has an STD.  SEEK IMMEDIATE MEDICAL CARE IF:   You have a fever.   You have increased abdominal or pelvic pain.   You have chills.   You have pain when you urinate.   You are not better after 72 hours following treatment.  MAKE SURE YOU:  Understand these instructions.  Will watch your condition.  Will get help right away if you are not doing well or get worse. Document Released: 04/30/2005 Document Revised: 08/25/2012 Document Reviewed: 04/26/2011 [facility] Patient Information 2015 [place], [place]. This information is not intended to replace advice given to you by your health care provider. Make sure you  discuss any questions you have with your health care provider.

## 2014-01-22 NOTE — ED Provider Notes (Signed)
Medical screening examination/treatment/procedure(s) were performed by non-physician practitioner and as supervising physician I was immediately available for consultation/collaboration.   EKG Interpretation None        Evelina Bucy, MD 01/22/14 2324

## 2014-01-22 NOTE — ED Provider Notes (Signed)
Patient signed out to me by [place], PA-C at change of shift. Will follow up on pelvic US. If hydrosalpinx or pyosalpinx will given 2 weeks of doxycyline and flagyl per recommendation of Dr. Elly Modena.  Patient reports improvement of symptoms. US shows likely hydrosalpinx, but cannot exclude pyosalpinx. additionally US shows thickened endometrial stripe. Patient is perimenopausal. She will follow up with OB/GYN for either endometrial sampling or follow up US. [place], PA-C discussed abnormal chest findings on abd CT with patient and she understands to follow up with chest CT as an outpatient.   6:54 PM Discussed Dr. Kennon Rounds who will have office call patient with an appointment. She agrees doxycycline and flagyl are appropriate therapies.   Results for orders placed during the hospital encounter of 01/22/14  WET PREP, GENITAL      Result Value Ref Range   Yeast Wet Prep HPF POC NONE SEEN  NONE SEEN   Trich, Wet Prep NONE SEEN  NONE SEEN   Clue Cells Wet Prep HPF POC FEW (*) NONE SEEN   WBC, Wet Prep HPF POC FEW (*) NONE SEEN  CBC WITH DIFFERENTIAL      Result Value Ref Range   WBC 12.9 (*) 4.0 - 10.5 K/uL   RBC 4.22  3.87 - 5.11 MIL/uL   Hemoglobin 12.5  12.0 - 15.0 g/dL   HCT 36.4  36.0 - 46.0 %   MCV 86.3  78.0 - 100.0 fL   MCH 29.6  26.0 - 34.0 pg   MCHC 34.3  30.0 - 36.0 g/dL   RDW 13.6  11.5 - 15.5 %   Platelets 335  150 - 400 K/uL   Neutrophils Relative % 66  43 - 77 %   Neutro Abs 8.6 (*) 1.7 - 7.7 K/uL   Lymphocytes Relative 23  12 - 46 %   Lymphs Abs 3.0  0.7 - 4.0 K/uL   Monocytes Relative 10  3 - 12 %   Monocytes Absolute 1.3 (*) 0.1 - 1.0 K/uL   Eosinophils Relative 1  0 - 5 %   Eosinophils Absolute 0.1  0.0 - 0.7 K/uL   Basophils Relative 0  0 - 1 %   Basophils Absolute 0.0  0.0 - 0.1 K/uL  COMPREHENSIVE METABOLIC PANEL      Result Value Ref Range   Sodium 134 (*) 137 - 147 mEq/L   Potassium 4.2  3.7 - 5.3 mEq/L   Chloride 98  96 - 112 mEq/L   CO2 23  19 - 32 mEq/L    Glucose, Bld 97  70 - 99 mg/dL   BUN 6  6 - 23 mg/dL   Creatinine, Ser 0.91  0.50 - 1.10 mg/dL   Calcium 9.7  8.4 - 10.5 mg/dL   Total Protein 7.8  6.0 - 8.3 g/dL   Albumin 3.5  3.5 - 5.2 g/dL   AST 25  0 - 37 U/L   ALT 38 (*) 0 - 35 U/L   Alkaline Phosphatase 118 (*) 39 - 117 U/L   Total Bilirubin 0.7  0.3 - 1.2 mg/dL   GFR calc non Af Amer 72 (*) >90 mL/min   GFR calc Af Amer 83 (*) >90 mL/min   Anion gap 13  5 - 15  LIPASE, BLOOD      Result Value Ref Range   Lipase 26  11 - 59 U/L  URINALYSIS, ROUTINE W REFLEX MICROSCOPIC      Result Value Ref Range   Color, Urine AMBER (*)  YELLOW   APPearance CLOUDY (*) CLEAR   Specific Gravity, Urine 1.018  1.005 - 1.030   pH 5.5  5.0 - 8.0   Glucose, UA NEGATIVE  NEGATIVE mg/dL   Hgb urine dipstick NEGATIVE  NEGATIVE   Bilirubin Urine NEGATIVE  NEGATIVE   Ketones, ur NEGATIVE  NEGATIVE mg/dL   Protein, ur NEGATIVE  NEGATIVE mg/dL   Urobilinogen, UA 1.0  0.0 - 1.0 mg/dL   Nitrite NEGATIVE  NEGATIVE   Leukocytes, UA MODERATE (*) NEGATIVE  URINE MICROSCOPIC-ADD ON      Result Value Ref Range   Squamous Epithelial / LPF MANY (*) RARE   WBC, UA 11-20  <3 WBC/hpf   RBC / HPF 0-2  <3 RBC/hpf   Bacteria, UA MANY (*) RARE   Urine-Other MUCOUS PRESENT    POC URINE PREG, ED      Result Value Ref Range   Preg Test, Ur NEGATIVE  NEGATIVE   US Transvaginal Non-ob  01/22/2014   CLINICAL DATA:  Right-sided adnexal pain. Evaluate for potential hydro or pyosalpinx.  EXAM: TRANSABDOMINAL AND TRANSVAGINAL ULTRASOUND OF PELVIS  TECHNIQUE: Both transabdominal and transvaginal ultrasound examinations of the pelvis were performed. Transabdominal technique was performed for global imaging of the pelvis including uterus, ovaries, adnexal regions, and pelvic cul-de-sac. It was necessary to proceed with endovaginal exam following the transabdominal exam to visualize the ovaries and adnexal regions.  COMPARISON:  CT of the abdomen and pelvis 01/22/2014.   FINDINGS: Uterus  Measurements: 13.9 x 8.4 x 10.8 cm. Multiple lesions of heterogeneous echotexture, most compatible with fibroids. The largest of these is in the left side of the fundus measuring 5.4 x 5.0 x 4.8 cm. In addition, there are multiple anechoic lesions with increased through transmission in the region of the cervix, compatible with nabothian cysts, largest of which measures 1.0 x 0.6 x 1.0 cm.  Endometrium  Thickness: 22.8 mm.  No focal abnormality visualized.  Right ovary  Not visualized.  Left ovary  Measurements: 3.2 x 2.0 x 2.2 cm. In the right adnexa there is a complex dilated tubular appearing structure adjacent to the right ovary and uterus. This contains hypoechoic material, and there is associated increase through transmission.  Other findings  No significant free fluid in the cul-de-sac.  IMPRESSION: 1. Dilated tubular appearing fluid filled structure in the right adnexa is favored to represent a hydrosalpinx. If the patient is exhibiting symptoms of infection, the possibility of a pyosalpinx is difficult to exclude, but is not strongly favored at this time. 2. Fibroid uterus. 3. Thickened endometrial stripe. If the patient is premenopausal, endometrial thickness is considered abnormal if >18 mm, and a follow-up US in 6-8 weeks, during the week immediately following menses (exam timing is critical) is recommended. If the patient is postmenopausal endometrial thickness is considered abnormal if >8 mm in asymptomatic post-menopausal female, in which case, endometrial sampling should be considered to exclude carcinoma. 4. Limited examination which was unable to visualize the right ovary. 5. Multiple small nabothian cysts incidentally noted.   Electronically Signed   By: Vinnie Langton M.D.   On: 01/22/2014 17:58   US Pelvis Complete  01/22/2014   CLINICAL DATA:  Right-sided adnexal pain. Evaluate for potential hydro or pyosalpinx.  EXAM: TRANSABDOMINAL AND TRANSVAGINAL ULTRASOUND OF PELVIS   TECHNIQUE: Both transabdominal and transvaginal ultrasound examinations of the pelvis were performed. Transabdominal technique was performed for global imaging of the pelvis including uterus, ovaries, adnexal regions, and pelvic cul-de-sac. It was necessary  to proceed with endovaginal exam following the transabdominal exam to visualize the ovaries and adnexal regions.  COMPARISON:  CT of the abdomen and pelvis 01/22/2014.  FINDINGS: Uterus  Measurements: 13.9 x 8.4 x 10.8 cm. Multiple lesions of heterogeneous echotexture, most compatible with fibroids. The largest of these is in the left side of the fundus measuring 5.4 x 5.0 x 4.8 cm. In addition, there are multiple anechoic lesions with increased through transmission in the region of the cervix, compatible with nabothian cysts, largest of which measures 1.0 x 0.6 x 1.0 cm.  Endometrium  Thickness: 22.8 mm.  No focal abnormality visualized.  Right ovary  Not visualized.  Left ovary  Measurements: 3.2 x 2.0 x 2.2 cm. In the right adnexa there is a complex dilated tubular appearing structure adjacent to the right ovary and uterus. This contains hypoechoic material, and there is associated increase through transmission.  Other findings  No significant free fluid in the cul-de-sac.  IMPRESSION: 1. Dilated tubular appearing fluid filled structure in the right adnexa is favored to represent a hydrosalpinx. If the patient is exhibiting symptoms of infection, the possibility of a pyosalpinx is difficult to exclude, but is not strongly favored at this time. 2. Fibroid uterus. 3. Thickened endometrial stripe. If the patient is premenopausal, endometrial thickness is considered abnormal if >18 mm, and a follow-up US in 6-8 weeks, during the week immediately following menses (exam timing is critical) is recommended. If the patient is postmenopausal endometrial thickness is considered abnormal if >8 mm in asymptomatic post-menopausal female, in which case, endometrial sampling  should be considered to exclude carcinoma. 4. Limited examination which was unable to visualize the right ovary. 5. Multiple small nabothian cysts incidentally noted.   Electronically Signed   By: Vinnie Langton M.D.   On: 01/22/2014 17:58   Ct Abdomen Pelvis W Contrast  01/22/2014   CLINICAL DATA:  Right lower quadrant abdominal pain  EXAM: CT ABDOMEN AND PELVIS WITH CONTRAST  TECHNIQUE: Multidetector CT imaging of the abdomen and pelvis was performed using the standard protocol following bolus administration of intravenous contrast.  CONTRAST:  4mL OMNIPAQUE IOHEXOL 300 MG/ML SOLN, 136mL OMNIPAQUE IOHEXOL 300 MG/ML SOLN  COMPARISON:  None.  FINDINGS: Lower Chest: Numerous pulmonary nodules noted throughout the visualized lower lungs bilaterally. These range in size 2-5 mm. The largest nodule is in the right middle lobe. Many of the nodules appear to be in a peribronchovascular distribution within almost tree-in-bud appearance. No definite calcifications although many of the nodules are dense for size. There are more than 10 nodules. The visualized heart is within normal limits for size. No pericardial effusion. Unremarkable distal thoracic esophagus.  Abdomen: Unremarkable CT appearance of the stomach, duodenum, spleen, adrenal glands and pancreas. Normal hepatic contour and morphology. Well-defined lobulated 3.2 cm water attenuation cyst in the superior aspect of the right liver. No suspicious hepatic lesions. Gallbladder is unremarkable. No intra or extrahepatic biliary ductal dilatation.  Surgical changes of prior left nephrectomy. No abnormal soft tissue within the resection bed and no evidence of periaortic adenopathy. The right kidney is unremarkable in appearance. No evidence of obstruction or focal bowel wall thickening. Normal appendix in the right lower quadrant. The terminal ileum is unremarkable. Fact containing umbilical hernia.  Pelvis: Large heterogeneous and lobulated uterus consistent with  fibroid uterus. The largest individual fibroid measures up to 7.2 cm exophytic from the anterior uterine wall. Intermediate attenuation dilated tubular structure in the right adnexa measuring up to 2.3 x 4.5  cm. The left adnexa is unremarkable. No free fluid or suspicious adenopathy.  Bones/Soft Tissues: No acute fracture or aggressive appearing lytic or blastic osseous lesion.  Vascular: No significant atherosclerotic vascular disease, aneurysmal dilatation or acute abnormality.  IMPRESSION: 1. Dilated tubular structure containing complex fluid intimately associated with the right adnexa concerning for complex hydro or pyosalpinx. This could represent a source for the patient's right lower quadrant pain. Consider further evaluation with transvaginal pelvic ultrasound. 2. Bulky fibroid uterus. 3. The appendix is normal. 4. Multiple (greater than 10) scattered bilateral pulmonary nodules, some of which are in a peribronchovascular distribution. While these may be infectious/inflammatory or the sequelae of old granulomatous disease, metastatic nodularity is difficult to exclude radiographically. Recommend dedicated CT scan of the chest to fully evaluate the lungs and mediastinum. 5. Surgical changes of prior left nephrectomy without evidence of abnormal soft tissue in the surgical bed or abnormal adenopathy. 6. Lobulated 3.2 cm hepatic cyst.   Electronically Signed   By: Jacqulynn Cadet M.D.   On: 01/22/2014 15:12     Elwyn Lade, PA-C 01/22/14 [place], PA-C 01/22/14 [place], PA-C 01/22/14 [id number]

## 2014-01-22 NOTE — ED Provider Notes (Signed)
CSN: [id number]     Arrival date & time 01/22/14  1115 History   First MD Initiated Contact with Patient 01/22/14 1144     Chief Complaint  Patient presents with  . Abdominal Pain     (Consider location/radiation/quality/duration/timing/severity/associated sxs/prior Treatment) The history is provided by the patient. No language interpreter was used.  Candace Hall is a 53 year old female with past medical history of anxiety, breast cyst excision, kidney surgery presenting to the ED with right lower quadrant pain that has been ongoing since Wednesday. Patient reports that the pain is a constant dull aching pain, with intermittent episodes of sharp shooting pain with radiation down her right leg. Stated that she's been having intermittent chills. Reported nausea that started today. Stated that she had her last bowel movement on Monday which is normal. Reported that she has a mild vaginal discharge-stated that she just finished her period and she normally has vaginal discharge after her menstrual cycle. Denied vomiting, diarrhea, melena, hematochezia, fever, chest pain, shortness of breath, difficulty breathing, pelvic pain, abnormal vaginal bleeding. PCP none  Past Medical History  Diagnosis Date  . Anxiety    Past Surgical History  Procedure Laterality Date  . Kidney surgery    . Breast cyst excision    . Corneal transplant     No family history on file. History  Substance Use Topics  . Smoking status: Never Smoker   . Smokeless tobacco: Not on file  . Alcohol Use: No   OB History   Grav Para Term Preterm Abortions TAB SAB Ect Mult Living                 Review of Systems  Constitutional: Positive for chills. Negative for fever.  Respiratory: Negative for chest tightness and shortness of breath.   Cardiovascular: Negative for chest pain.  Gastrointestinal: Positive for nausea and abdominal pain. Negative for vomiting, diarrhea, constipation, blood in stool and anal  bleeding.  Genitourinary: Positive for vaginal discharge. Negative for dysuria, vaginal bleeding and vaginal pain.  Neurological: Negative for weakness and headaches.      Allergies  Penicillins  Home Medications   Prior to Admission medications   Not on File   BP 137/77  Pulse 86  Temp(Src) 98.7 F (37.1 C) (Oral)  Resp 16  SpO2 100%  LMP 01/15/2014 Physical Exam  Nursing note and vitals reviewed. Constitutional: She is oriented to person, place, and time. She appears well-developed and well-nourished. No distress.  HENT:  Head: Normocephalic and atraumatic.  Mouth/Throat: Oropharynx is clear and moist. No oropharyngeal exudate.  Eyes: Conjunctivae and EOM are normal. Pupils are equal, round, and reactive to light. Right eye exhibits no discharge. Left eye exhibits no discharge.  Neck: Normal range of motion. Neck supple. No tracheal deviation present.  Cardiovascular: Normal rate, regular rhythm and normal heart sounds.  Exam reveals no friction rub.   No murmur heard. Pulmonary/Chest: Effort normal and breath sounds normal. No respiratory distress. She has no wheezes. She has no rales.  Abdominal: Soft. Bowel sounds are normal. She exhibits no distension. There is tenderness in the right lower quadrant. There is no rebound and no guarding.  Negative abdominal distension  BS normoactive in all 4 quadrants Abdomen soft upon palpation  Discomfort upon palpation to the RLQ  Genitourinary:  Pelvic exam: Negative swelling, erythema, inflammation, lesions, sores, deformities noted to the external genitalia and vaginal region. Negative blood in the vaginal vault. Thin yellow discharge with negative odor identified. Negative  CMT. Negative left adnexal tenderness. Discomfort upon palpation to the right adnexal region. Exam chaperoned with tech.  Musculoskeletal: Normal range of motion.  Lymphadenopathy:    She has no cervical adenopathy.  Neurological: She is alert and oriented  to person, place, and time. No cranial nerve deficit. She exhibits normal muscle tone. Coordination normal.  Skin: Skin is warm and dry. No rash noted. She is not diaphoretic. No erythema.  Psychiatric: She has a normal mood and affect. Her behavior is normal. Thought content normal.    ED Course  Procedures (including critical care time)  3:59 PM This provider re-assessed the patient. Patient is sitting comfortably upright in bed. Discussed labs, imaging in great detail. Discussed findings regarding nodules noted on CT chest. Patient understood and agreed.   4:04 PM This provider spoke with Dr. Elly Modena, OBGYN physician on call. Discussed case with possible pyosalpinx and [place] pending. Reported that flagyl and doxycycline can be used. Reported that Doxycycline can be used 100 mg BID and Flagyl 500 mg BID for 14 days. Reported that if patient has fever, increased nausea and vomiting and worsening pain needs to be admitted.   Results for orders placed during the hospital encounter of 01/22/14  CBC WITH DIFFERENTIAL      Result Value Ref Range   WBC 12.9 (*) 4.0 - 10.5 K/uL   RBC 4.22  3.87 - 5.11 MIL/uL   Hemoglobin 12.5  12.0 - 15.0 g/dL   HCT 36.4  36.0 - 46.0 %   MCV 86.3  78.0 - 100.0 fL   MCH 29.6  26.0 - 34.0 pg   MCHC 34.3  30.0 - 36.0 g/dL   RDW 13.6  11.5 - 15.5 %   Platelets 335  150 - 400 K/uL   Neutrophils Relative % 66  43 - 77 %   Neutro Abs 8.6 (*) 1.7 - 7.7 K/uL   Lymphocytes Relative 23  12 - 46 %   Lymphs Abs 3.0  0.7 - 4.0 K/uL   Monocytes Relative 10  3 - 12 %   Monocytes Absolute 1.3 (*) 0.1 - 1.0 K/uL   Eosinophils Relative 1  0 - 5 %   Eosinophils Absolute 0.1  0.0 - 0.7 K/uL   Basophils Relative 0  0 - 1 %   Basophils Absolute 0.0  0.0 - 0.1 K/uL  COMPREHENSIVE METABOLIC PANEL      Result Value Ref Range   Sodium 134 (*) 137 - 147 mEq/L   Potassium 4.2  3.7 - 5.3 mEq/L   Chloride 98  96 - 112 mEq/L   CO2 23  19 - 32 mEq/L   Glucose, Bld 97  70 - 99 mg/dL    BUN 6  6 - 23 mg/dL   Creatinine, Ser 0.91  0.50 - 1.10 mg/dL   Calcium 9.7  8.4 - 10.5 mg/dL   Total Protein 7.8  6.0 - 8.3 g/dL   Albumin 3.5  3.5 - 5.2 g/dL   AST 25  0 - 37 U/L   ALT 38 (*) 0 - 35 U/L   Alkaline Phosphatase 118 (*) 39 - 117 U/L   Total Bilirubin 0.7  0.3 - 1.2 mg/dL   GFR calc non Af Amer 72 (*) >90 mL/min   GFR calc Af Amer 83 (*) >90 mL/min   Anion gap 13  5 - 15  LIPASE, BLOOD      Result Value Ref Range   Lipase 26  11 - 59 U/L  URINALYSIS, ROUTINE W REFLEX MICROSCOPIC      Result Value Ref Range   Color, Urine AMBER (*) YELLOW   APPearance CLOUDY (*) CLEAR   Specific Gravity, Urine 1.018  1.005 - 1.030   pH 5.5  5.0 - 8.0   Glucose, UA NEGATIVE  NEGATIVE mg/dL   Hgb urine dipstick NEGATIVE  NEGATIVE   Bilirubin Urine NEGATIVE  NEGATIVE   Ketones, ur NEGATIVE  NEGATIVE mg/dL   Protein, ur NEGATIVE  NEGATIVE mg/dL   Urobilinogen, UA 1.0  0.0 - 1.0 mg/dL   Nitrite NEGATIVE  NEGATIVE   Leukocytes, UA MODERATE (*) NEGATIVE  URINE MICROSCOPIC-ADD ON      Result Value Ref Range   Squamous Epithelial / LPF MANY (*) RARE   WBC, UA 11-20  <3 WBC/hpf   RBC / HPF 0-2  <3 RBC/hpf   Bacteria, UA MANY (*) RARE   Urine-Other MUCOUS PRESENT    POC URINE PREG, ED      Result Value Ref Range   Preg Test, Ur NEGATIVE  NEGATIVE    Labs Review Labs Reviewed  CBC WITH DIFFERENTIAL - Abnormal; Notable for the following:    WBC 12.9 (*)    Neutro Abs 8.6 (*)    Monocytes Absolute 1.3 (*)    All other components within normal limits  COMPREHENSIVE METABOLIC PANEL - Abnormal; Notable for the following:    Sodium 134 (*)    ALT 38 (*)    Alkaline Phosphatase 118 (*)    GFR calc non Af Amer 72 (*)    GFR calc Af Amer 83 (*)    All other components within normal limits  URINALYSIS, ROUTINE W REFLEX MICROSCOPIC - Abnormal; Notable for the following:    Color, Urine AMBER (*)    APPearance CLOUDY (*)    Leukocytes, UA MODERATE (*)    All other components within  normal limits  URINE MICROSCOPIC-ADD ON - Abnormal; Notable for the following:    Squamous Epithelial / LPF MANY (*)    Bacteria, UA MANY (*)    All other components within normal limits  GC/CHLAMYDIA PROBE AMP  WET PREP, GENITAL  URINE CULTURE  LIPASE, BLOOD  POC URINE PREG, ED    Imaging Review No results found.   EKG Interpretation None      MDM   Final diagnoses:  None    Medications  sodium chloride 0.9 % bolus 1,000 mL (1,000 mLs Intravenous New Bag/Given 01/22/14 1306)  iohexol (OMNIPAQUE) 300 MG/ML solution 50 mL (50 mLs Oral Contrast Given 01/22/14 1320)   Filed Vitals:   01/22/14 1122 01/22/14 1415  BP: 139/96 137/77  Pulse: 91 86  Temp: 98.5 F (36.9 C) 98.7 F (37.1 C)  TempSrc: Oral Oral  Resp: 18 16  SpO2: 98% 100%   CBC mildly elevated 12.9. CMP noted mild low subtle 134. Mildly elevated ALT of 38, alkaline phosphatase 118. Negative anion gap. Lipase negative elevation. Urine pregnancy negative. Urinalysis negative for hemoglobin, nitrites. Moderate leukocytes identified with white blood cell count of 11-20-many squamous cells noted to be a contaminate specimen. Urine culture pending. Few clue cells, few red blood cells identified are wet prep. GC Chlamydia probe pending. CT abdomen and pelvis with contrast identified a dilated tubular structure containing complex fluid in the right adnexa they-cannot rule out complex hydro-or pyosalpinx. The appendix is normal. Multiple scattered bilateral pulmonary nodules are noted-this may be infectious/inflammatory, metastatic nodularity cannot be ruled out. [place] pending to rule out possible  hydrosalpinx versus pyosalpinx. Discussed case with OBGYN who recommended Doxycycline and Flagyl to be used for treatment. Discussed with patient labs and imaging and to follow up regarding CT chest with nodules found. Discussed case with Elwyn Lade, PA-C. Transfer of care to Elwyn Lade, PA-C.   Jamse Mead,  PA-C 01/22/14 1724

## 2014-01-22 NOTE — Progress Notes (Signed)
  CARE MANAGEMENT ED NOTE 01/22/2014  Patient:  Candace Hall, Candace Hall   Account Number:  [id number]  Date Initiated:  01/22/2014  Documentation initiated by:  Westly Pam Assessment:   53 yr old [place] pt she has RLQ abdominal pain, radiating to her right leg since Tuesday night and began feeling nauseas yesterday. Pt denies diarrhea/vomiting. Pt A&O in NAD.     Subjective/Objective Assessment Detail:   pt confirms no pcp but covered by [employer]  pt voiced understanding and appreciation of services rendered     Action/Plan:   see notes below   Action/Plan Detail:   Anticipated DC Date:       Status Recommendation to Physician:   Result of Recommendation:    Other ED [place]  PCP issues  Other  Outpatient Services - Pt will follow up    Choice offered to / List presented to:            Status of service:  Completed, signed off  ED Comments:   ED Comments Detail:  [facility] ED CM spoke with pt on how to obtain an in network pcp with insurance coverage via the customer service number or web site Cm reviewed ED level of care for crisis/emergent services and community pcp level of care to manage continuous or chronic medical concerns.  The pt voiced understanding CM encouraged pt and discussed pt's responsibility to verify with pt's insurance carrier that any recommended medical provider offered by any emergency room or a hospital provider is within the carrier's network. The pt voiced understanding

## 2014-01-22 NOTE — ED Notes (Signed)
Patient transported to Ultrasound 

## 2014-01-23 LAB — GC/CHLAMYDIA PROBE AMP
CT Probe RNA: NEGATIVE
GC PROBE AMP APTIMA: NEGATIVE

## 2014-01-23 LAB — URINE CULTURE
Colony Count: NO GROWTH
Culture: NO GROWTH
Special Requests: NORMAL

## 2014-01-29 NOTE — ED Provider Notes (Signed)
Medical screening examination/treatment/procedure(s) were performed by non-physician practitioner and as supervising physician I was immediately available for consultation/collaboration.   EKG Interpretation None        Houston Siren III, MD 01/29/14 [phone]

## 2014-02-18 ENCOUNTER — Ambulatory Visit (INDEPENDENT_AMBULATORY_CARE_PROVIDER_SITE_OTHER): Payer: 59 | Admitting: Obstetrics & Gynecology

## 2014-02-18 ENCOUNTER — Encounter: Payer: Self-pay | Admitting: Obstetrics & Gynecology

## 2014-02-18 VITALS — BP 144/82 | HR 86 | Ht 63.0 in | Wt 193.1 lb

## 2014-02-18 DIAGNOSIS — D259 Leiomyoma of uterus, unspecified: Secondary | ICD-10-CM

## 2014-02-18 DIAGNOSIS — N946 Dysmenorrhea, unspecified: Secondary | ICD-10-CM

## 2014-02-18 LAB — POCT PREGNANCY, URINE: Preg Test, Ur: NEGATIVE

## 2014-02-18 MED ORDER — IBUPROFEN 600 MG PO TABS: 600.0000 mg | ORAL_TABLET | Freq: Four times a day (QID) | ORAL | Status: DC | PRN

## 2014-02-18 NOTE — Progress Notes (Signed)
Patient ID: Candace Hall, female   DOB: Sep 13, 1960, 53 y.o.   MRN: [id number]  Chief Complaint  Patient presents with  . Follow-up    HPI Candace Hall is a 53 y.o. female.  [id number] Patient's last menstrual period was 01/31/2014.s/P BTL, presented to [facility] 9/11 with lower abdominal pain recent heavy period. US showed right hydrosalpinx and fibroids  HPI  Past Medical History  Diagnosis Date  . Anxiety     Past Surgical History  Procedure Laterality Date  . Kidney surgery    . Breast cyst excision    . Corneal transplant      No family history on file.  Social History History  Substance Use Topics  . Smoking status: Never Smoker   . Smokeless tobacco: Not on file  . Alcohol Use: No    Allergies  Allergen Reactions  . Penicillins Anaphylaxis    Current Outpatient Prescriptions  Medication Sig Dispense Refill  . doxycycline (VIBRAMYCIN) 50 MG capsule Take 2 capsules (100 mg total) by mouth 2 (two) times daily.  56 capsule  0  . HYDROcodone-acetaminophen (NORCO/VICODIN) 5-325 MG per tablet Take 1-2 tablets by mouth every 6 (six) hours as needed for moderate pain or severe pain.  12 tablet  0  . ibuprofen (ADVIL,MOTRIN) 600 MG tablet Take 1 tablet (600 mg total) by mouth every 6 (six) hours as needed (use during menses).  30 tablet  3  . metroNIDAZOLE (FLAGYL) 500 MG tablet Take 1 tablet (500 mg total) by mouth 2 (two) times daily.  28 tablet  0  . ondansetron (ZOFRAN) 4 MG tablet Take 1 tablet (4 mg total) by mouth every 6 (six) hours.  12 tablet  0   No current facility-administered medications for this visit.    Review of Systems Review of Systems  Constitutional:       VMS  Respiratory: Negative.   Genitourinary: Positive for menstrual problem and pelvic pain. Negative for vaginal bleeding and vaginal discharge.  Skin: Negative.   Psychiatric/Behavioral: Negative for sleep disturbance.    Blood pressure 144/82, pulse 86, height 5\' 3"  (1.6 m), weight  193 lb 1.6 oz (87.59 kg), last menstrual period 01/31/2014.  Physical Exam Physical Exam  Nursing note and vitals reviewed. Constitutional: She appears well-developed. No distress.  Pulmonary/Chest: Effort normal. No respiratory distress.  Abdominal: Soft. There is no tenderness.  Genitourinary: Vagina normal. No vaginal discharge found.  Uterus 8 weeks size and mild right side tenderness  Neurological: She is alert.  Skin: Skin is warm and dry.  Psychiatric: She has a normal mood and affect. Her behavior is normal.    Data Reviewed CLINICAL DATA: Right-sided adnexal pain. Evaluate for potential  hydro or pyosalpinx.  EXAM:  TRANSABDOMINAL AND TRANSVAGINAL ULTRASOUND OF PELVIS  TECHNIQUE:  Both transabdominal and transvaginal ultrasound examinations of the  pelvis were performed. Transabdominal technique was performed for  global imaging of the pelvis including uterus, ovaries, adnexal  regions, and pelvic cul-de-sac. It was necessary to proceed with  endovaginal exam following the transabdominal exam to visualize the  ovaries and adnexal regions.  COMPARISON: CT of the abdomen and pelvis 01/22/2014.  FINDINGS:  Uterus  Measurements: 13.9 x 8.4 x 10.8 cm. Multiple lesions of  heterogeneous echotexture, most compatible with fibroids. The  largest of these is in the left side of the fundus measuring 5.4 x  5.0 x 4.8 cm. In addition, there are multiple anechoic lesions with  increased through transmission in the region of  the cervix,  compatible with nabothian cysts, largest of which measures 1.0 x 0.6  x 1.0 cm.  Endometrium  Thickness: 22.8 mm. No focal abnormality visualized.  Right ovary  Not visualized.  Left ovary  Measurements: 3.2 x 2.0 x 2.2 cm. In the right adnexa there is a  complex dilated tubular appearing structure adjacent to the right  ovary and uterus. This contains hypoechoic material, and there is  associated increase through transmission.  Other findings   No significant free fluid in the cul-de-sac.  IMPRESSION:  1. Dilated tubular appearing fluid filled structure in the right  adnexa is favored to represent a hydrosalpinx. If the patient is  exhibiting symptoms of infection, the possibility of a pyosalpinx is  difficult to exclude, but is not strongly favored at this time.  2. Fibroid uterus.  3. Thickened endometrial stripe. If the patient is premenopausal,  endometrial thickness is considered abnormal if >18 mm, and a  follow-up US in 6-8 weeks, during the week immediately following  menses (exam timing is critical) is recommended. If the patient is  postmenopausal endometrial thickness is considered abnormal if >8 mm  in asymptomatic post-menopausal female, in which case, endometrial  sampling should be considered to exclude carcinoma.  4. Limited examination which was unable to visualize the right  ovary.  5. Multiple small nabothian cysts incidentally noted.  Electronically Signed  By: Vinnie Langton M.D.  On: 01/22/2014 17:58  CBC    Component Value Date/Time   WBC 12.9* 01/22/2014 1207   RBC 4.22 01/22/2014 1207   HGB 12.5 01/22/2014 1207   HCT 36.4 01/22/2014 1207   PLT 335 01/22/2014 1207   MCV 86.3 01/22/2014 1207   MCH 29.6 01/22/2014 1207   MCHC 34.3 01/22/2014 1207   RDW 13.6 01/22/2014 1207   LYMPHSABS 3.0 01/22/2014 1207   MONOABS 1.3* 01/22/2014 1207   EOSABS 0.1 01/22/2014 1207   BASOSABS 0.0 01/22/2014 1207      Assessment    Fibroid uterus and possible right hydrosalpinx with menorrhagia and dysmenorrhea, perimenopausal    Plan    FSH, CBC, menstrual calendar and f/u 3 months. Ibuprofen during menses        ARNOLD,JAMES 02/18/2014, 4:19 PM

## 2014-02-19 LAB — FOLLICLE STIMULATING HORMONE: FSH: 22.6 m[IU]/mL

## 2014-02-19 LAB — CBC
HCT: 32.8 % — ABNORMAL LOW (ref 36.0–46.0)
Hemoglobin: 11.1 g/dL — ABNORMAL LOW (ref 12.0–15.0)
MCH: 28.8 pg (ref 26.0–34.0)
MCHC: 33.8 g/dL (ref 30.0–36.0)
MCV: 85 fL (ref 78.0–100.0)
PLATELETS: 441 10*3/uL — AB (ref 150–400)
RBC: 3.86 MIL/uL — AB (ref 3.87–5.11)
RDW: 14.1 % (ref 11.5–15.5)
WBC: 7.8 10*3/uL (ref 4.0–10.5)

## 2014-02-19 LAB — CYTOLOGY - PAP

## 2014-03-15 ENCOUNTER — Encounter: Payer: Self-pay | Admitting: Obstetrics & Gynecology

## 2014-05-21 ENCOUNTER — Encounter: Payer: 59 | Admitting: Family Medicine

## 2014-05-24 ENCOUNTER — Ambulatory Visit (INDEPENDENT_AMBULATORY_CARE_PROVIDER_SITE_OTHER): Payer: 59 | Admitting: Obstetrics & Gynecology

## 2014-05-24 VITALS — BP 146/90 | HR 78 | Temp 98.9°F | Ht 63.0 in | Wt 194.3 lb

## 2014-05-24 DIAGNOSIS — N959 Unspecified menopausal and perimenopausal disorder: Secondary | ICD-10-CM

## 2014-05-24 NOTE — Patient Instructions (Signed)
Menopause Menopause is the normal time of life when menstrual periods stop completely. Menopause is complete when you have missed 12 consecutive menstrual periods. It usually occurs between the ages of 48 years and 55 years. Very rarely does a woman develop menopause before the age of 40 years. At menopause, your ovaries stop producing the female hormones estrogen and progesterone. This can cause undesirable symptoms and also affect your health. Sometimes the symptoms may occur 4-5 years before the menopause begins. There is no relationship between menopause and:  Oral contraceptives.  Number of children you had.  Race.  The age your menstrual periods started (menarche). Heavy smokers and very thin women may develop menopause earlier in life. CAUSES  The ovaries stop producing the female hormones estrogen and progesterone.  Other causes include:  Surgery to remove both ovaries.  The ovaries stop functioning for no known reason.  Tumors of the pituitary gland in the brain.  Medical disease that affects the ovaries and hormone production.  Radiation treatment to the abdomen or pelvis.  Chemotherapy that affects the ovaries. SYMPTOMS   Hot flashes.  Night sweats.  Decrease in sex drive.  Vaginal dryness and thinning of the vagina causing painful intercourse.  Dryness of the skin and developing wrinkles.  Headaches.  Tiredness.  Irritability.  Memory problems.  Weight gain.  Bladder infections.  Hair growth of the face and chest.  Infertility. More serious symptoms include:  Loss of bone (osteoporosis) causing breaks (fractures).  Depression.  Hardening and narrowing of the arteries (atherosclerosis) causing heart attacks and strokes. DIAGNOSIS   When the menstrual periods have stopped for 12 straight months.  Physical exam.  Hormone studies of the blood. TREATMENT  There are many treatment choices and nearly as many questions about them. The  decisions to treat or not to treat menopausal changes is an individual choice made with your health care provider. Your health care provider can discuss the treatments with you. Together, you can decide which treatment will work best for you. Your treatment choices may include:   Hormone therapy (estrogen and progesterone).  Non-hormonal medicines.  Treating the individual symptoms with medicine (for example antidepressants for depression).  Herbal medicines that may help specific symptoms.  Counseling by a psychiatrist or psychologist.  Group therapy.  Lifestyle changes including:  Eating healthy.  Regular exercise.  Limiting caffeine and alcohol.  Stress management and meditation.  No treatment. HOME CARE INSTRUCTIONS   Take the medicine your health care provider gives you as directed.  Get plenty of sleep and rest.  Exercise regularly.  Eat a diet that contains calcium (good for the bones) and soy products (acts like estrogen hormone).  Avoid alcoholic beverages.  Do not smoke.  If you have hot flashes, dress in layers.  Take supplements, calcium, and vitamin D to strengthen bones.  You can use over-the-counter lubricants or moisturizers for vaginal dryness.  Group therapy is sometimes very helpful.  Acupuncture may be helpful in some cases. SEEK MEDICAL CARE IF:   You are not sure you are in menopause.  You are having menopausal symptoms and need advice and treatment.  You are still having menstrual periods after age 55 years.  You have pain with intercourse.  Menopause is complete (no menstrual period for 12 months) and you develop vaginal bleeding.  You need a referral to a specialist (gynecologist, psychiatrist, or psychologist) for treatment. SEEK IMMEDIATE MEDICAL CARE IF:   You have severe depression.  You have excessive vaginal bleeding.    You fell and think you have a broken bone.  You have pain when you urinate.  You develop leg or  chest pain.  You have a fast pounding heart beat (palpitations).  You have severe headaches.  You develop vision problems.  You feel a lump in your breast.  You have abdominal pain or severe indigestion. Document Released: 07/21/2003 Document Revised: 12/31/2012 Document Reviewed: 11/27/2012 Exit[employer] Patient Information 2015 [employer]. This information is not intended to replace advice given to you by your health care provider. Make sure you discuss any questions you have with your health care provider.  

## 2014-05-24 NOTE — Progress Notes (Signed)
Patient ID: Candace Hall, female   DOB: 1960-07-15, 53 y.o.   MRN: [id number] No LMP recorded. No bleeding and patient has menopausal sx. NO c/o pain. [place] >22. Dx Menopause, fibroid uterus.  Mammogram recommended and ordered.  Woodroe Mode, MD 05/24/2014

## 2014-12-02 ENCOUNTER — Ambulatory Visit (INDEPENDENT_AMBULATORY_CARE_PROVIDER_SITE_OTHER): Payer: Commercial Managed Care - HMO | Admitting: Physician Assistant

## 2014-12-02 VITALS — BP 126/80 | HR 83 | Temp 98.3°F | Resp 16 | Ht 62.0 in | Wt 203.4 lb

## 2014-12-02 DIAGNOSIS — M5442 Lumbago with sciatica, left side: Secondary | ICD-10-CM | POA: Diagnosis not present

## 2014-12-02 DIAGNOSIS — Z905 Acquired absence of kidney: Secondary | ICD-10-CM | POA: Insufficient documentation

## 2014-12-02 MED ORDER — CYCLOBENZAPRINE HCL ER 15 MG PO CP24: 15.0000 mg | ORAL_CAPSULE | Freq: Every day | ORAL | Status: DC | PRN
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
## 2014-12-02 NOTE — Patient Instructions (Signed)
Take amrix at bedtime. Take tylenol 1000 mg three times a day. Heating pad, gentle massage and gentle stretching. Return if 2 weeks if your symptoms are not improving.

## 2015-01-12 ENCOUNTER — Ambulatory Visit (HOSPITAL_COMMUNITY)
Admission: RE | Admit: 2015-01-12 | Discharge: 2015-01-12 | Disposition: A | Discharge status: Home / Self Care | Payer: Commercial Managed Care - HMO | Source: Ambulatory Visit | Attending: Obstetrics & Gynecology | Admitting: Obstetrics & Gynecology

## 2015-01-12 ENCOUNTER — Other Ambulatory Visit: Payer: Self-pay | Admitting: Obstetrics & Gynecology

## 2015-01-12 DIAGNOSIS — Z1231 Encounter for screening mammogram for malignant neoplasm of breast: Secondary | ICD-10-CM

## 2015-01-12 DIAGNOSIS — N959 Unspecified menopausal and perimenopausal disorder: Secondary | ICD-10-CM | POA: Insufficient documentation

## 2015-01-18 ENCOUNTER — Other Ambulatory Visit: Payer: Self-pay | Admitting: Obstetrics & Gynecology

## 2015-01-18 DIAGNOSIS — R928 Other abnormal and inconclusive findings on diagnostic imaging of breast: Secondary | ICD-10-CM

## 2015-01-21 ENCOUNTER — Other Ambulatory Visit: Payer: Self-pay

## 2015-01-21 ENCOUNTER — Other Ambulatory Visit: Payer: Self-pay | Admitting: Obstetrics & Gynecology

## 2015-01-21 DIAGNOSIS — R928 Other abnormal and inconclusive findings on diagnostic imaging of breast: Secondary | ICD-10-CM

## 2015-01-25 ENCOUNTER — Ambulatory Visit
Admission: RE | Admit: 2015-01-25 | Discharge: 2015-01-25 | Disposition: A | Discharge status: Home / Self Care | Payer: Commercial Managed Care - HMO | Source: Ambulatory Visit | Attending: Obstetrics & Gynecology | Admitting: Obstetrics & Gynecology

## 2015-01-25 DIAGNOSIS — R928 Other abnormal and inconclusive findings on diagnostic imaging of breast: Secondary | ICD-10-CM

## 2015-01-26 ENCOUNTER — Encounter: Payer: Self-pay | Admitting: Obstetrics & Gynecology

## 2015-12-04 ENCOUNTER — Encounter (HOSPITAL_COMMUNITY): Payer: Self-pay | Admitting: *Deleted

## 2015-12-04 ENCOUNTER — Emergency Department (HOSPITAL_COMMUNITY): Payer: Commercial Managed Care - HMO

## 2015-12-04 ENCOUNTER — Emergency Department (HOSPITAL_COMMUNITY)
Admission: EM | Admit: 2015-12-04 | Discharge: 2015-12-04 | Disposition: A | Discharge status: Home / Self Care | Payer: Commercial Managed Care - HMO | Attending: Emergency Medicine | Admitting: Emergency Medicine

## 2015-12-04 DIAGNOSIS — R0789 Other chest pain: Secondary | ICD-10-CM | POA: Diagnosis not present

## 2015-12-04 DIAGNOSIS — E876 Hypokalemia: Secondary | ICD-10-CM | POA: Diagnosis not present

## 2015-12-04 DIAGNOSIS — R42 Dizziness and giddiness: Secondary | ICD-10-CM

## 2015-12-04 DIAGNOSIS — R0602 Shortness of breath: Secondary | ICD-10-CM | POA: Diagnosis not present

## 2015-12-04 DIAGNOSIS — I10 Essential (primary) hypertension: Secondary | ICD-10-CM | POA: Insufficient documentation

## 2015-12-04 DIAGNOSIS — R112 Nausea with vomiting, unspecified: Secondary | ICD-10-CM

## 2015-12-04 DIAGNOSIS — R079 Chest pain, unspecified: Secondary | ICD-10-CM

## 2015-12-04 LAB — CBC
HCT: 38.8 % (ref 36.0–46.0)
Hemoglobin: 13.5 g/dL (ref 12.0–15.0)
MCH: 30.8 pg (ref 26.0–34.0)
MCHC: 34.8 g/dL (ref 30.0–36.0)
MCV: 88.4 fL (ref 78.0–100.0)
PLATELETS: 297 10*3/uL (ref 150–400)
RBC: 4.39 MIL/uL (ref 3.87–5.11)
RDW: 12.7 % (ref 11.5–15.5)
WBC: 10.9 10*3/uL — AB (ref 4.0–10.5)

## 2015-12-04 LAB — BASIC METABOLIC PANEL
ANION GAP: 8 (ref 5–15)
BUN: 16 mg/dL (ref 6–20)
CHLORIDE: 108 mmol/L (ref 101–111)
CO2: 23 mmol/L (ref 22–32)
Calcium: 9.7 mg/dL (ref 8.9–10.3)
Creatinine, Ser: 0.95 mg/dL (ref 0.44–1.00)
GFR calc Af Amer: >60 mL/min (ref >60)
GFR calc non Af Amer: >60 mL/min (ref >60)
GLUCOSE: 146 mg/dL — AB (ref 65–99)
POTASSIUM: 3.2 mmol/L — AB (ref 3.5–5.1)
Sodium: 139 mmol/L (ref 135–145)

## 2015-12-04 LAB — I-STAT TROPONIN, ED
TROPONIN I, POC: 0 ng/mL (ref 0.00–0.08)
Troponin i, poc: 0.01 ng/mL (ref 0.00–0.08)

## 2015-12-04 MED ORDER — ASPIRIN 81 MG PO CHEW
324.0000 mg | CHEWABLE_TABLET | Freq: Once | ORAL | Status: AC
  Administered 2015-12-04: 324 mg via ORAL
  Filled 2015-12-04: qty 4

## 2015-12-04 MED ORDER — ASPIRIN 325 MG PO TABS
325.0000 mg | ORAL_TABLET | Freq: Once | ORAL | Status: DC
  Filled 2015-12-04: qty 1

## 2015-12-04 MED ORDER — ONDANSETRON 8 MG PO TBDP: 8.0000 mg | ORAL_TABLET | Freq: Three times a day (TID) | ORAL | 0 refills | Status: DC | PRN

## 2015-12-04 MED ORDER — SODIUM CHLORIDE 0.9 % IV BOLUS (SEPSIS)
1000.0000 mL | Freq: Once | INTRAVENOUS | Status: AC
  Administered 2015-12-04: 1000 mL via INTRAVENOUS

## 2015-12-04 MED ORDER — ONDANSETRON HCL 4 MG/2ML IJ SOLN
4.0000 mg | Freq: Once | INTRAMUSCULAR | Status: AC
  Administered 2015-12-04: 4 mg via INTRAVENOUS
  Filled 2015-12-04: qty 2

## 2015-12-04 MED ORDER — POTASSIUM CHLORIDE CRYS ER 20 MEQ PO TBCR
40.0000 meq | EXTENDED_RELEASE_TABLET | Freq: Once | ORAL | Status: AC
  Administered 2015-12-04: 40 meq via ORAL
  Filled 2015-12-04: qty 2

## 2015-12-04 NOTE — ED Provider Notes (Signed)
[facility] DEPT Provider Note   CSN: [id number] Arrival date & time: 12/04/15  1135  First Provider Contact:  None       History   Chief Complaint Chief Complaint  Patient presents with  . Dizziness  . Shortness of Breath    HPI Candace Hall is a 55 y.o. female with a PMHx of HTN, who presents to the ED with complaints of sudden onset lightheadedness with standing after she got up from being on the toilet at 10 AM with associated chest pressure, nausea, 2 episodes of nonbloody nonbilious emesis, intermittent shortness of breath, and diaphoresis upon arriving into the emergency room. She states that she did not feel well when she woke up, and initially felt lightheaded when she stood up from the toilet, then went into her living room and felt somewhat of a room spinning sensation but that resolved and she continues having the lightheadedness feeling when she gets up. She describes her chest pain as 4/10 central intermittent pressure-like pain that is nonradiating with no known aggravating factors, unchanged with exertion movement or inspiration, and with no treatments tried prior to arrival. She also states that the right side of her face around her eye feels "funny" but cannot describe this further, and denies that it has any numbness or tingling.  She denies any fevers, chills, claudication, orthopnea, new cough, leg swelling, recent travel/surgery/immobilization, personal or family history of DVT/PE, estrogen use, speech difficulty or word finding difficulty, facial droop, headache, vision changes, abdominal pain, hematemesis, diarrhea, constipation, melena, hematochezia, dysuria, hematuria, numbness, tingling, or focal weakness. She is a nonsmoker with no family history of cardiac disease.   The history is provided by the patient. No language interpreter was used.  Dizziness   This is a new problem. The current episode started 1 to 2 hours ago. Episode frequency: intermittent.  The problem has not changed since onset.There was no injury mechanism. Associated symptoms include nausea and vomiting. Pertinent negatives include no numbness, no blurred vision, no decreased vision, no double vision, no tingling and no weakness.  Shortness of Breath  Associated symptoms include chest pain, diaphoresis, nausea and vomiting. Pertinent negatives include no abdominal pain, arthralgias, chills, coughing, fever, headaches, myalgias, numbness, visual change or weakness.  Chest Pain  Quality:  Lightheadedness and head spinning Onset quality:  Sudden Duration:  2 hours Timing:  Intermittent Progression:  Unchanged Chronicity:  New Relieved by:  None tried Worsened by:  Standing up Ineffective treatments:  None tried Associated symptoms: chest pain, nausea, shortness of breath and vomiting   Associated symptoms: no blood in stool, no diarrhea, no headaches, no vision changes and no weakness     Past Medical History:  Diagnosis Date  . Allergy   . Anxiety   . Cataract   . Hypertension     Patient Active Problem List   Diagnosis Date Noted  . Solitary kidney, acquired 12/02/2014  . Menopausal and perimenopausal disorder 05/24/2014  . Fibroid uterus 02/18/2014  . Dysmenorrhea 02/18/2014    Past Surgical History:  Procedure Laterality Date  . BREAST CYST EXCISION    . CORNEAL TRANSPLANT    . KIDNEY SURGERY    . TUBAL LIGATION      OB History    Gravida Para Term Preterm AB Living   4 2 2   2 2    SAB TAB Ectopic Multiple Live Births     2             Home Medications  Prior to Admission medications   Medication Sig Start Date End Date Taking? Authorizing Provider  ondansetron (ZOFRAN ODT) 8 MG disintegrating tablet Take 1 tablet (8 mg total) by mouth every 8 (eight) hours as needed for nausea or vomiting. 12/04/15   Misbah Hornaday Camprubi-Soms, PA-C    Family History Family History  Problem Relation Age of Onset  . Hypertension Mother   . Stroke Mother     . Stroke Sister   . Hypertension Brother     Social History Social History  Substance Use Topics  . Smoking status: Never Smoker  . Smokeless tobacco: Never Used  . Alcohol use No     Allergies   Penicillins   Review of Systems Review of Systems  Constitutional: Positive for diaphoresis. Negative for chills and fever.  Eyes: Negative for blurred vision, double vision and visual disturbance.  Respiratory: Positive for shortness of breath. Negative for cough.   Cardiovascular: Positive for chest pain. Negative for leg swelling.  Gastrointestinal: Positive for nausea and vomiting. Negative for abdominal pain, blood in stool, constipation and diarrhea.  Genitourinary: Negative for dysuria and hematuria.  Musculoskeletal: Negative for arthralgias and myalgias.  Skin: Negative for color change.  Allergic/Immunologic: Negative for immunocompromised state.  Neurological: Positive for dizziness and light-headedness. Negative for tingling, weakness, numbness and headaches.  Psychiatric/Behavioral: Negative for confusion.   10 Systems reviewed and are negative for acute change except as noted in the HPI.   Physical Exam Updated Vital Signs BP 140/77 (BP Location: Left Arm)   Pulse 85   Temp 97.8 F (36.6 C) (Oral)   Ht 5\' 2"  (1.575 m)   LMP 01/31/2014   SpO2 98%   Physical Exam  Constitutional: She is oriented to person, place, and time. Vital signs are normal. She appears well-developed and well-nourished.  Non-toxic appearance. No distress.  Afebrile, nontoxic, NAD  HENT:  Head: Normocephalic and atraumatic.  Mouth/Throat: Oropharynx is clear and moist and mucous membranes are normal.  No facial droop or slurred speech  Eyes: Conjunctivae and EOM are normal. Pupils are equal, round, and reactive to light. Right eye exhibits no discharge. Left eye exhibits no discharge.  PERRL, EOMI, no nystagmus, no visual field deficits   Neck: Normal range of motion. Neck supple. No  spinous process tenderness and no muscular tenderness present. No neck rigidity. Normal range of motion present.  FROM intact without spinous process TTP, no bony stepoffs or deformities, no paraspinous muscle TTP or muscle spasms. No rigidity or meningeal signs. No bruising or swelling.   Cardiovascular: Normal rate, regular rhythm, normal heart sounds and intact distal pulses.  Exam reveals no gallop and no friction rub.   No murmur heard. RRR, nl s1/s2, no m/r/g, distal pulses intact, no pedal edema   Pulmonary/Chest: Effort normal and breath sounds normal. No respiratory distress. She has no decreased breath sounds. She has no wheezes. She has no rhonchi. She has no rales.  CTAB in all lung fields, no w/r/r, no hypoxia or increased WOB, speaking in full sentences, SpO2 98% on RA   Abdominal: Soft. Normal appearance and bowel sounds are normal. She exhibits no distension. There is no tenderness. There is no rigidity, no rebound, no guarding, no CVA tenderness, no tenderness at McBurney's point and negative Murphy's sign.  Musculoskeletal: Normal range of motion.  MAE x4 Strength and sensation grossly intact Distal pulses intact Gait steady and nonataxic, although pt very uncooperative and requires some coaxing to complete a gait exam  Neurological: She  is alert and oriented to person, place, and time. She has normal strength. No cranial nerve deficit or sensory deficit. She displays a negative Romberg sign. Coordination and gait normal. GCS eye subscore is 4. GCS verbal subscore is 5. GCS motor subscore is 6.  CN 2-12 grossly intact A&O x4 GCS 15 Sensation and strength intact Gait nonataxic including with tandem walking Coordination with finger-to-nose WNL Neg pronator drift, neg romberg  Skin: Skin is warm, dry and intact. No rash noted.  Psychiatric: She has a normal mood and affect.  Nursing note and vitals reviewed.  1322 Orthostatic Vital Signs AFTER fluids Orthostatic Lying -  BP-  Lying: 130/78  Pulse- Lying: 64  Orthostatic Sitting -  BP- Sitting: 123/74  Pulse- Sitting: 64  Orthostatic Standing at 0 minutes -  BP- Standing at 0 minutes: 135/74  Pulse- Standing at 0 minutes: 74      ED Treatments / Results  Labs (all labs ordered are listed, but only abnormal results are displayed) Labs Reviewed  BASIC METABOLIC PANEL - Abnormal; Notable for the following:       Result Value   Potassium 3.2 (*)    Glucose, Bld 146 (*)    All other components within normal limits  CBC - Abnormal; Notable for the following:    WBC 10.9 (*)    All other components within normal limits  I-STAT TROPOININ, ED  I-STAT TROPOININ, ED    EKG  EKG Interpretation  Date/Time:  Sunday December 04 2015 11:57:18 EDT Ventricular Rate:  70 PR Interval:    QRS Duration: 101 QT Interval:  399 QTC Calculation: 431 R Axis:   90 Text Interpretation:  Sinus rhythm Borderline prolonged PR interval Borderline right axis deviation Borderline T wave abnormalities Minimal ST elevation, anterior leads Baseline wander in lead(s) I II III aVR aVL No significant change since last tracing Confirmed by BEATON  MD, ROBERT ([id number]) on 12/04/2015 12:34:50 PM       Radiology Dg Chest 2 View  Result Date: 12/04/2015 CLINICAL DATA:  Shortness of breath, chest pain, nausea EXAM: CHEST  2 VIEW COMPARISON:  06/28/2013 FINDINGS: Lungs are clear.  No pleural effusion or pneumothorax. The heart is normal size. Mild degenerative changes of the visualized thoracolumbar spine. Surgical clips in the left upper abdomen. IMPRESSION: No evidence of acute cardiopulmonary disease. Electronically Signed   By: Julian Hy M.D.   On: 12/04/2015 12:41  Ct Head Wo Contrast  Result Date: 12/04/2015 CLINICAL DATA:  Dizziness and nausea.  Chest pain and pressure. EXAM: CT HEAD WITHOUT CONTRAST TECHNIQUE: Contiguous axial images were obtained from the base of the skull through the vertex without intravenous contrast. COMPARISON:   None. FINDINGS: There is opacification of scattered ethmoid air cells. Paranasal sinuses are otherwise within normal limits. The mastoid air cells and middle ears are well aerated. The bones are no normal. Rmal the extracranial soft tissue are within normal limits. No subdural, epidural, or subarachnoid hemorrhage. No mass, mass effect, or midline shift. Ventricles and sulci are normal. Cerebellum, brainstem, and basal cisterns are normal. No acute cortical ischemia or infarct. IMPRESSION: 1. Scattered opacification of scattered ethmoid air cells. No other abnormalities. Electronically Signed   By: Dorise Bullion III M.D   On: 12/04/2015 13:28   Procedures Procedures (including critical care time)  Medications Ordered in ED Medications  ondansetron [facility]) injection 4 mg (4 mg Intravenous Given 12/04/15 1258)  sodium chloride 0.9 % bolus 1,000 mL (0 mLs Intravenous Stopped 12/04/15  1532)  aspirin chewable tablet 324 mg (324 mg Oral Given 12/04/15 1258)  potassium chloride SA (K-DUR,KLOR-CON) CR tablet 40 mEq (40 mEq Oral Given 12/04/15 1402)     Initial Impression / Assessment and Plan / ED Course  I have reviewed the triage vital signs and the nursing notes.  Pertinent labs & imaging results that were available during my care of the patient were reviewed by me and considered in my medical decision making (see chart for details).  Clinical Course  Value Comment By Time  EKG 12-Lead (Reviewed) Leonard Schwartz, MD 07/23 1234    55 y.o. female here with c/o dizziness that initially was lightheadedness and then was a vertiginous feeling but that went away and now she just feels lightheaded with standing/sitting, accompanied by nausea, 2x vomiting, diaphoresis, chest pressure, and intermittent SOB. On exam, no focal neuro deficits, although pt somewhat uncooperative with gait exam; able to ambulate without assistance but takes some coaxing to get her to perform what I'm asking. States her R  periorbital area feels "funny" but can't describe it, and denies numbness/tingling. No facial droop or slurred speech. Neg pronator drift. Clear lungs, no hypoxia or tachycardia, highly doubt PE as a possibility. CXR clear, EKG without acute ischemic findings- looks the same as prior EKG. BMP with mildly low K 3.2, will replete orally. CBC with minimally elevated WBC 10.9 but appears hemoconcentrated since H/H slightly higher than baseline, could also indicate some dehydration. Trop neg. Most of her symptoms have subsided aside from being lightheaded with standing. Will give zofran, fluids, ASA, and check head CT to ensure this is not an acute stroke although I doubt it. May consider getting delta trop, although HEART score low so doubt need for admission. Pt declined meclizine since she doesn't feel vertiginous anymore, and declines wanting anything for pain. Will reassess shortly   1:45 PM CT head neg for acute findings aside from scattered ethmoid opacification. Orthostatics neg but they were done after fluids were given, so this doesn't give [place] much information. Pt actually feels better now that fluids are almost done. no longer nauseated either. Symptoms have fully resolved. Given her story, but low HEART score, will delta trop at 3:10 PM. Will continue to monitor.   4:33 PM Pt feeling improved, second trop neg. Tolerating PO well here. Overall, symptoms likely related to dehydration. Will send home with zofran. Doubt need for ongoing emergent work up or intervention, doubt ACS or concerning cardiopulmonary or neurologic condition. F/up with [facility] in 1wk for recheck of symptoms and to establish care. Stay well hydated. I explained the diagnosis and have given explicit precautions to return to the ER including for any other new or worsening symptoms. The patient understands and accepts the medical plan as it's been dictated and I have answered their questions. Discharge instructions concerning home care and  prescriptions have been given. The patient is STABLE and is discharged to home in good condition.   Final Clinical Impressions(s) / ED Diagnoses   Final diagnoses:  Dizziness  Lightheadedness  Non-intractable vomiting with nausea, vomiting of unspecified type  SOB (shortness of breath)  Chest pain at rest  Hypokalemia  Orthostatic dizziness    New Prescriptions New Prescriptions   ONDANSETRON (ZOFRAN ODT) 8 MG DISINTEGRATING TABLET    Take 1 tablet (8 mg total) by mouth every 8 (eight) hours as needed for nausea or vomiting.     [address] [place], PA-C 12/04/15 [place], MD 12/04/15 1710

## 2015-12-04 NOTE — ED Triage Notes (Signed)
Per EMS - patient states she woke up this morning with "a little" nausea, no vomiting.  Patient became short of breath with chest pain/pressure around 11 am.  Patient also c/o dizziness.  Patient's 12 lead unremarkable.  Patient states she has a hx of HTN, but isn't taking meds.  Patient's BP 164/84, HR 88, RR 20, 98% on RA, CBG 113.  Lung sounds clear.

## 2015-12-04 NOTE — Discharge Instructions (Signed)
Your symptoms today are likely related to dehydration. Stay very well hydrated with plenty of water. Use zofran as directed as needed for nausea. Use tylenol or motrin as needed for any pain you may have. Follow up with [facility] in 1-2 weeks for recheck of symptoms and to establish medical care, or you can use the list below to find a regular doctor, or use your insurance website to find a provider in your area that accepts your insurance. Return to the ER for changes or worsening symptoms.

## 2016-04-14 ENCOUNTER — Emergency Department (HOSPITAL_COMMUNITY)
Admission: EM | Admit: 2016-04-14 | Discharge: 2016-04-14 | Disposition: A | Discharge status: Home / Self Care | Payer: Commercial Managed Care - HMO | Attending: Emergency Medicine | Admitting: Emergency Medicine

## 2016-04-14 ENCOUNTER — Encounter (HOSPITAL_COMMUNITY): Payer: Self-pay | Admitting: Emergency Medicine

## 2016-04-14 DIAGNOSIS — I10 Essential (primary) hypertension: Secondary | ICD-10-CM | POA: Diagnosis not present

## 2016-04-14 DIAGNOSIS — M546 Pain in thoracic spine: Secondary | ICD-10-CM | POA: Diagnosis not present

## 2016-04-14 DIAGNOSIS — R079 Chest pain, unspecified: Secondary | ICD-10-CM | POA: Diagnosis present

## 2016-04-14 DIAGNOSIS — Y939 Activity, unspecified: Secondary | ICD-10-CM | POA: Diagnosis not present

## 2016-04-14 DIAGNOSIS — Y92410 Unspecified street and highway as the place of occurrence of the external cause: Secondary | ICD-10-CM | POA: Insufficient documentation

## 2016-04-14 DIAGNOSIS — R0789 Other chest pain: Secondary | ICD-10-CM | POA: Diagnosis not present

## 2016-04-14 DIAGNOSIS — Y999 Unspecified external cause status: Secondary | ICD-10-CM | POA: Insufficient documentation

## 2016-04-14 MED ORDER — METHOCARBAMOL 500 MG PO TABS: 500.0000 mg | ORAL_TABLET | Freq: Every evening | ORAL | 0 refills | Status: DC | PRN

## 2016-04-14 NOTE — Discharge Instructions (Signed)
Take anti-inflammatory medicine (Aleve) for the next week. Take this medicine with food. Take muscle relaxer at bedtime to help you sleep. This medicine makes you drowsy so do not take before driving or work Use a heating pad for sore muscles - use for 20 minutes several times a day
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
## 2016-04-15 DIAGNOSIS — I10 Essential (primary) hypertension: Secondary | ICD-10-CM | POA: Insufficient documentation

## 2016-04-15 DIAGNOSIS — K219 Gastro-esophageal reflux disease without esophagitis: Secondary | ICD-10-CM | POA: Diagnosis not present

## 2016-04-15 DIAGNOSIS — Z79899 Other long term (current) drug therapy: Secondary | ICD-10-CM | POA: Diagnosis not present

## 2016-04-15 DIAGNOSIS — R072 Precordial pain: Secondary | ICD-10-CM | POA: Diagnosis present

## 2016-04-15 NOTE — ED Triage Notes (Signed)
Pt states she woke suddenly in severe right sided chest pain. With it, she experienced mild facial paralysis and right arm tingling. She denies cardiopulmonary medical hx, states she has been told she has HTN.

## 2016-04-16 ENCOUNTER — Emergency Department (HOSPITAL_COMMUNITY)
Admission: EM | Admit: 2016-04-16 | Discharge: 2016-04-16 | Disposition: A | Discharge status: Home / Self Care | Payer: Commercial Managed Care - HMO | Attending: Emergency Medicine | Admitting: Emergency Medicine

## 2016-04-16 ENCOUNTER — Emergency Department (HOSPITAL_COMMUNITY): Payer: Commercial Managed Care - HMO

## 2016-04-16 ENCOUNTER — Encounter (HOSPITAL_COMMUNITY): Payer: Self-pay | Admitting: Nurse Practitioner

## 2016-04-16 DIAGNOSIS — K219 Gastro-esophageal reflux disease without esophagitis: Secondary | ICD-10-CM

## 2016-04-16 LAB — CBC WITH DIFFERENTIAL/PLATELET
BASOS PCT: 0 %
Basophils Absolute: 0 10*3/uL (ref 0.0–0.1)
EOS ABS: 0.1 10*3/uL (ref 0.0–0.7)
EOS PCT: 2 %
HCT: 39.2 % (ref 36.0–46.0)
HEMOGLOBIN: 13.5 g/dL (ref 12.0–15.0)
Lymphocytes Relative: 51 %
Lymphs Abs: 4.8 10*3/uL — ABNORMAL HIGH (ref 0.7–4.0)
MCH: 30.4 pg (ref 26.0–34.0)
MCHC: 34.4 g/dL (ref 30.0–36.0)
MCV: 88.3 fL (ref 78.0–100.0)
MONO ABS: 0.7 10*3/uL (ref 0.1–1.0)
MONOS PCT: 8 %
NEUTROS PCT: 39 %
Neutro Abs: 3.6 10*3/uL (ref 1.7–7.7)
PLATELETS: 279 10*3/uL (ref 150–400)
RBC: 4.44 MIL/uL (ref 3.87–5.11)
RDW: 12.6 % (ref 11.5–15.5)
WBC: 9.3 10*3/uL (ref 4.0–10.5)

## 2016-04-16 LAB — I-STAT TROPONIN, ED
TROPONIN I, POC: 0 ng/mL (ref 0.00–0.08)
Troponin i, poc: 0 ng/mL (ref 0.00–0.08)

## 2016-04-16 LAB — I-STAT CHEM 8, ED
BUN: 14 mg/dL (ref 6–20)
CALCIUM ION: 1.24 mmol/L (ref 1.15–1.40)
Chloride: 104 mmol/L (ref 101–111)
Creatinine, Ser: 1.1 mg/dL — ABNORMAL HIGH (ref 0.44–1.00)
Glucose, Bld: 117 mg/dL — ABNORMAL HIGH (ref 65–99)
HEMATOCRIT: 39 % (ref 36.0–46.0)
HEMOGLOBIN: 13.3 g/dL (ref 12.0–15.0)
Potassium: 3.9 mmol/L (ref 3.5–5.1)
SODIUM: 139 mmol/L (ref 135–145)
TCO2: 26 mmol/L (ref 0–100)

## 2016-04-16 MED ORDER — OMEPRAZOLE 20 MG PO CPDR: 20.0000 mg | DELAYED_RELEASE_CAPSULE | Freq: Every day | ORAL | 0 refills | Status: DC

## 2016-04-16 MED ORDER — GI COCKTAIL ~~LOC~~
30.0000 mL | Freq: Once | ORAL | Status: AC
  Administered 2016-04-16: 30 mL via ORAL
  Filled 2016-04-16: qty 30
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
## 2016-04-22 IMAGING — CT CT ABD-PELV W/ CM
1 of 3 series · 13 of 32 positions shown, 18 images · IV contrast (OMNIPAQUE 300)
Comparison: None.

CLINICAL DATA: Right lower quadrant abdominal pain

EXAM:
CT ABDOMEN AND PELVIS WITH CONTRAST
TECHNIQUE: Multidetector CT imaging of the abdomen and pelvis was performed
using the standard protocol following bolus administration of
intravenous contrast.
CONTRAST:  50mL OMNIPAQUE IOHEXOL 300 MG/ML SOLN, 100mL OMNIPAQUE
IOHEXOL 300 MG/ML SOLN

[Series 2: abd/pel with · axial · 0.71mm/px · z∈[-103,+307]mm · 13 of 92 slices shown, 18 images]
[im 5/92  soft-tissue]
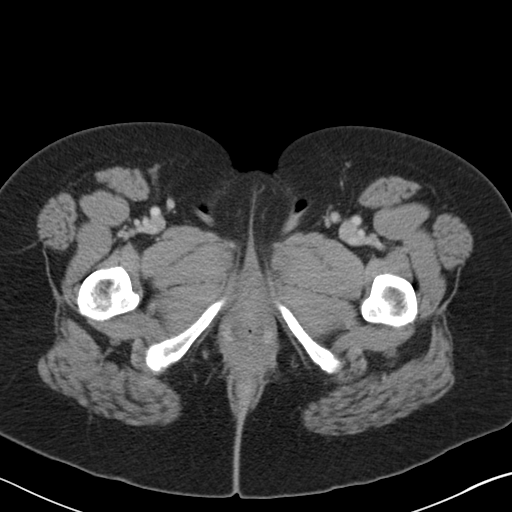
[im 5/92  bone]
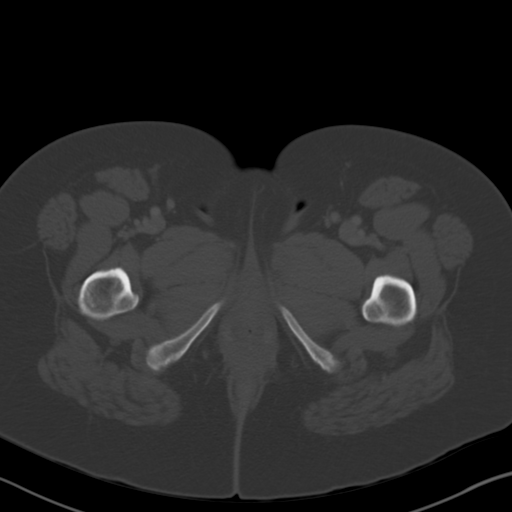
[im 15/92  soft-tissue]
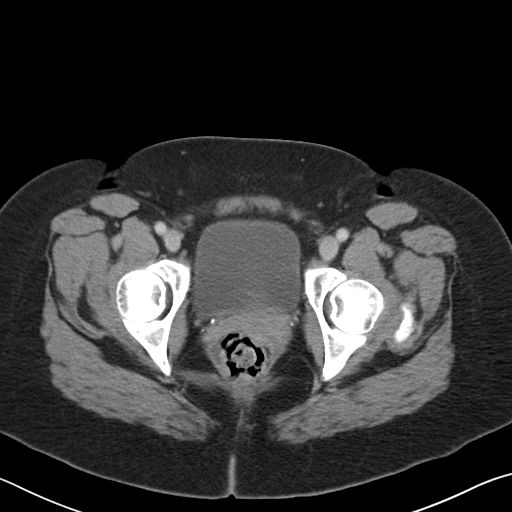
[im 20/92  soft-tissue]
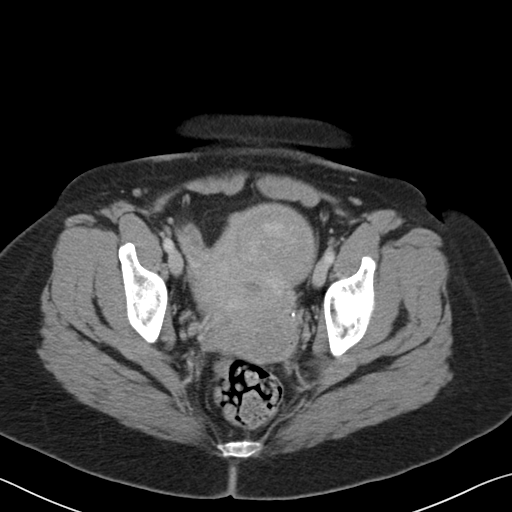
[im 29/92  soft-tissue]
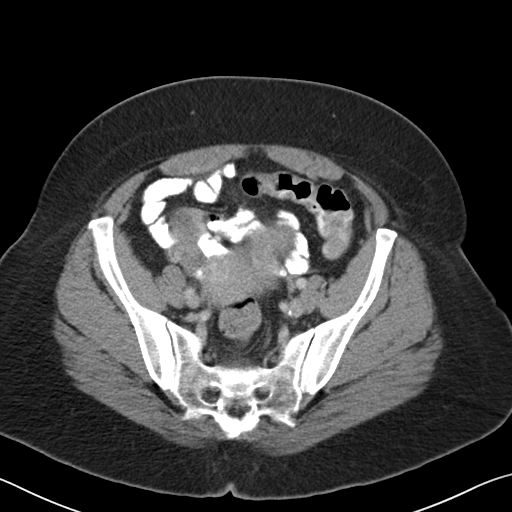
[im 34/92  soft-tissue]
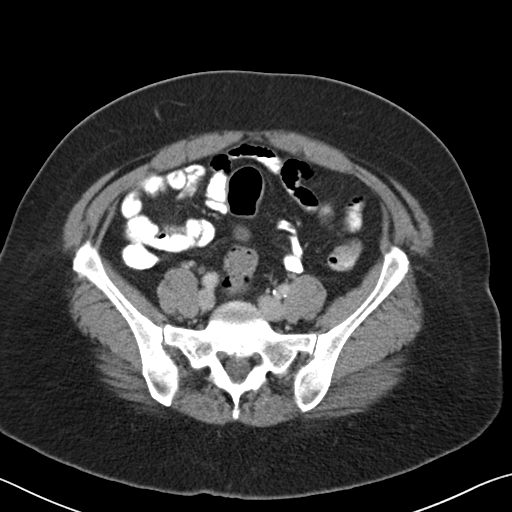
[im 44/92  soft-tissue]
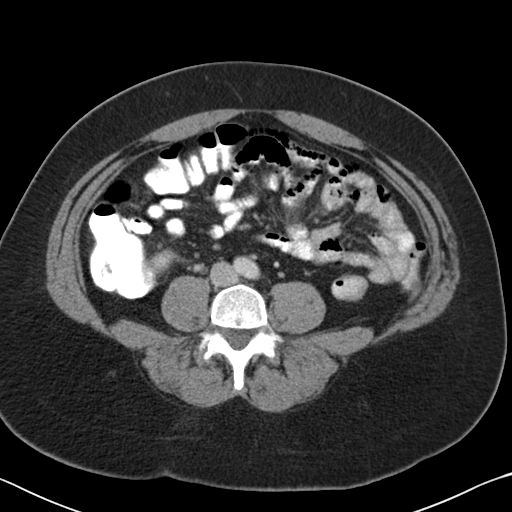
[im 48/92  soft-tissue]
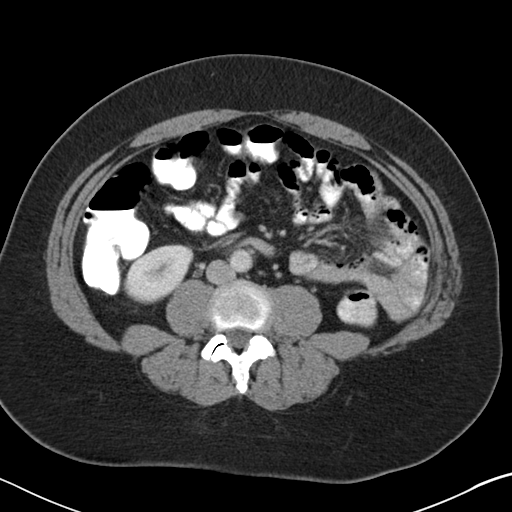
[im 58/92  soft-tissue]
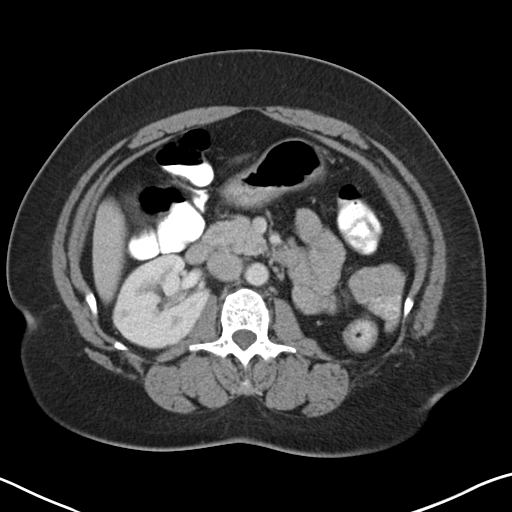
[im 63/92  soft-tissue]
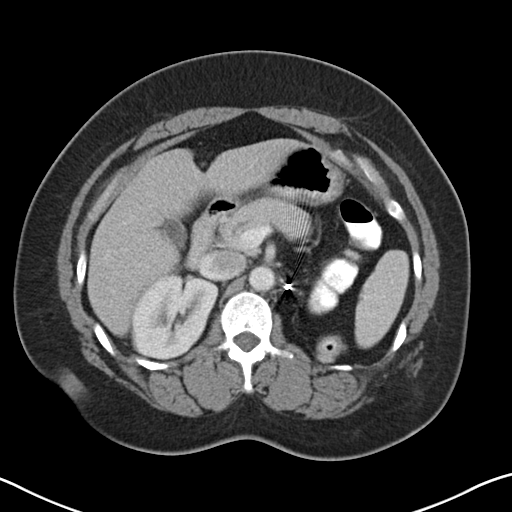
[im 63/92  bone]
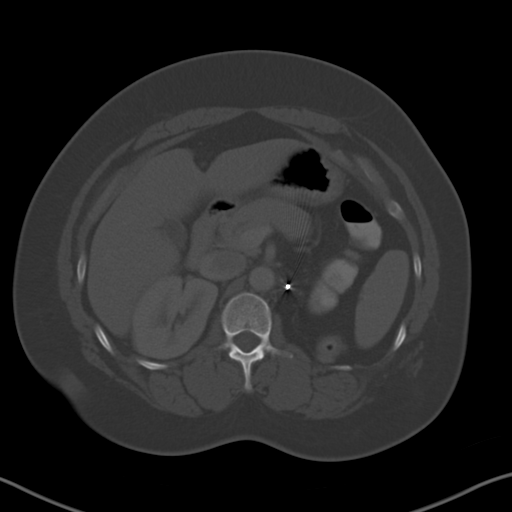
[im 72/92  soft-tissue]
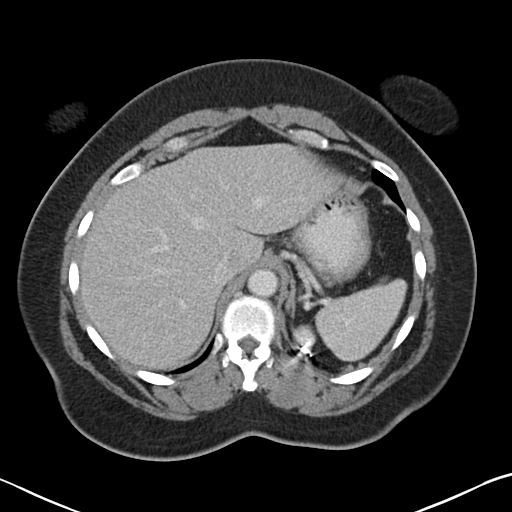
[im 72/92  lung]
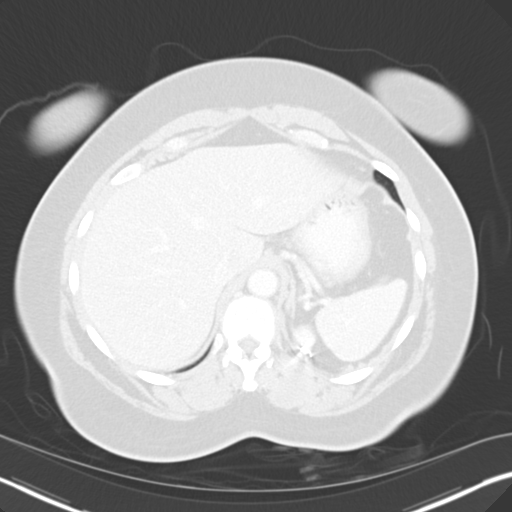
[im 77/92  soft-tissue]
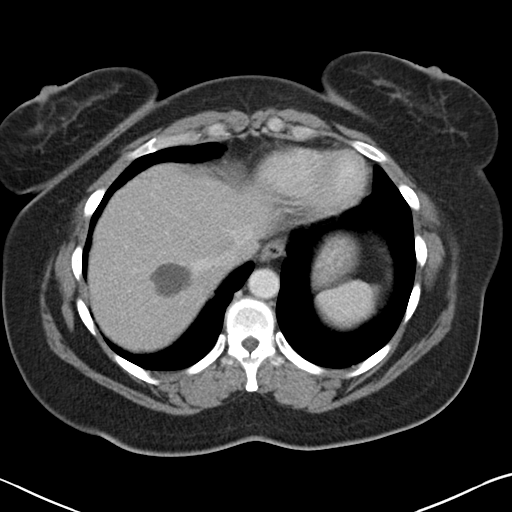
[im 77/92  lung]
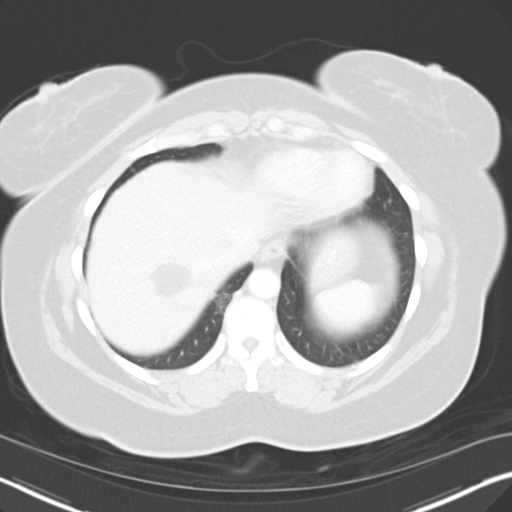
[im 82/92  lung]
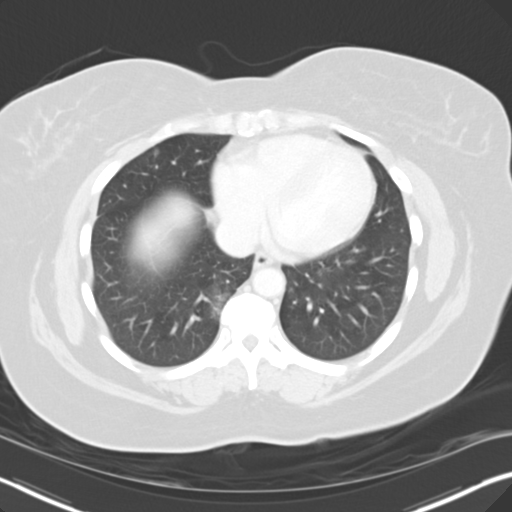
[im 87/92  soft-tissue]
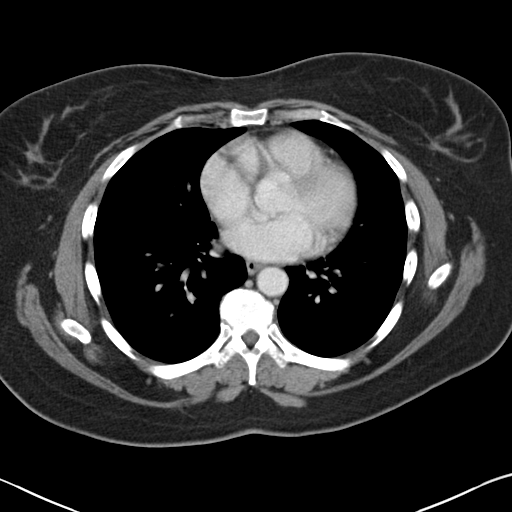
[im 87/92  lung]
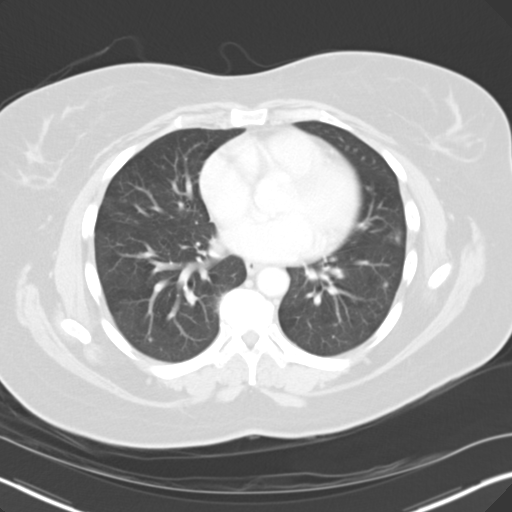

[13 of 32 positions shown; findings below may reference images not displayed]

FINDINGS: Lower Chest: Numerous pulmonary nodules noted throughout the
visualized lower lungs bilaterally. These range in size 2-5 mm. The
largest nodule is in the right middle lobe. Many of the nodules
appear to be in a peribronchovascular distribution within almost
tree-in-bud appearance. No definite calcifications although many of
the nodules are dense for size. There are more than 10 nodules. The
visualized heart is within normal limits for size. No pericardial
effusion. Unremarkable distal thoracic esophagus.

Abdomen: Unremarkable CT appearance of the stomach, duodenum,
spleen, adrenal glands and pancreas. Normal hepatic contour and
morphology. Well-defined lobulated 3.2 cm water attenuation cyst in
the superior aspect of the right liver. No suspicious hepatic
lesions. Gallbladder is unremarkable. No intra or extrahepatic
biliary ductal dilatation.

Surgical changes of prior left nephrectomy. No abnormal soft tissue
within the resection bed and no evidence of periaortic adenopathy.
The right kidney is unremarkable in appearance. No evidence of
obstruction or focal bowel wall thickening. Normal appendix in the
right lower quadrant. The terminal ileum is unremarkable. Fact
containing umbilical hernia.

Pelvis: Large heterogeneous and lobulated uterus consistent with
fibroid uterus. The largest individual fibroid measures up to 7.2 cm
exophytic from the anterior uterine wall. Intermediate attenuation
dilated tubular structure in the right adnexa measuring up to 2.3 x
4.5 cm. The left adnexa is unremarkable. No free fluid or suspicious
adenopathy.

Bones/Soft Tissues: No acute fracture or aggressive appearing lytic
or blastic osseous lesion.

Vascular: No significant atherosclerotic vascular disease,
aneurysmal dilatation or acute abnormality.
IMPRESSION: 1. Dilated tubular structure containing complex fluid intimately
associated with the right adnexa concerning for complex hydro or
pyosalpinx. This could represent a source for the patient's right
lower quadrant pain. Consider further evaluation with transvaginal
pelvic ultrasound.
2. Bulky fibroid uterus.
3. The appendix is normal.
4. Multiple (greater than 10) scattered bilateral pulmonary nodules,
some of which are in a peribronchovascular distribution. While these
may be infectious/inflammatory or the sequelae of old granulomatous
disease, metastatic nodularity is difficult to exclude
radiographically. Recommend dedicated CT scan of the chest to fully
evaluate the lungs and mediastinum.
5. Surgical changes of prior left nephrectomy without evidence of
abnormal soft tissue in the surgical bed or abnormal adenopathy.
6. Lobulated 3.2 cm hepatic cyst.

## 2016-11-06 ENCOUNTER — Emergency Department (HOSPITAL_COMMUNITY): Payer: 59

## 2016-11-06 ENCOUNTER — Encounter (HOSPITAL_COMMUNITY): Payer: Self-pay

## 2016-11-06 ENCOUNTER — Emergency Department (HOSPITAL_COMMUNITY)
Admission: EM | Admit: 2016-11-06 | Discharge: 2016-11-06 | Disposition: A | Discharge status: Home / Self Care | Payer: 59 | Attending: Emergency Medicine | Admitting: Emergency Medicine

## 2016-11-06 DIAGNOSIS — F419 Anxiety disorder, unspecified: Secondary | ICD-10-CM | POA: Diagnosis not present

## 2016-11-06 DIAGNOSIS — R0789 Other chest pain: Secondary | ICD-10-CM | POA: Diagnosis not present

## 2016-11-06 DIAGNOSIS — I10 Essential (primary) hypertension: Secondary | ICD-10-CM | POA: Insufficient documentation

## 2016-11-06 DIAGNOSIS — Z79899 Other long term (current) drug therapy: Secondary | ICD-10-CM | POA: Insufficient documentation

## 2016-11-06 DIAGNOSIS — R079 Chest pain, unspecified: Secondary | ICD-10-CM | POA: Diagnosis present

## 2016-11-06 LAB — CBC WITH DIFFERENTIAL/PLATELET
Basophils Absolute: 0 10*3/uL (ref 0.0–0.1)
Basophils Relative: 1 %
EOS ABS: 0.2 10*3/uL (ref 0.0–0.7)
EOS PCT: 2 %
HCT: 38.2 % (ref 36.0–46.0)
HEMOGLOBIN: 13 g/dL (ref 12.0–15.0)
LYMPHS ABS: 2.7 10*3/uL (ref 0.7–4.0)
Lymphocytes Relative: 42 %
MCH: 30 pg (ref 26.0–34.0)
MCHC: 34 g/dL (ref 30.0–36.0)
MCV: 88.2 fL (ref 78.0–100.0)
MONO ABS: 0.4 10*3/uL (ref 0.1–1.0)
MONOS PCT: 7 %
Neutro Abs: 3.1 10*3/uL (ref 1.7–7.7)
Neutrophils Relative %: 48 %
Platelets: 241 10*3/uL (ref 150–400)
RBC: 4.33 MIL/uL (ref 3.87–5.11)
RDW: 12.7 % (ref 11.5–15.5)
WBC: 6.4 10*3/uL (ref 4.0–10.5)

## 2016-11-06 LAB — COMPREHENSIVE METABOLIC PANEL
ALK PHOS: 57 U/L (ref 38–126)
ALT: 22 U/L (ref 14–54)
ANION GAP: 6 (ref 5–15)
AST: 27 U/L (ref 15–41)
Albumin: 3.7 g/dL (ref 3.5–5.0)
BILIRUBIN TOTAL: 0.6 mg/dL (ref 0.3–1.2)
BUN: 9 mg/dL (ref 6–20)
CALCIUM: 9.6 mg/dL (ref 8.9–10.3)
CO2: 24 mmol/L (ref 22–32)
Chloride: 108 mmol/L (ref 101–111)
Creatinine, Ser: 1.01 mg/dL — ABNORMAL HIGH (ref 0.44–1.00)
GFR calc non Af Amer: >60 mL/min (ref >60)
Glucose, Bld: 111 mg/dL — ABNORMAL HIGH (ref 65–99)
Potassium: 4 mmol/L (ref 3.5–5.1)
SODIUM: 138 mmol/L (ref 135–145)
TOTAL PROTEIN: 6.4 g/dL — AB (ref 6.5–8.1)

## 2016-11-06 LAB — I-STAT TROPONIN, ED
Troponin i, poc: 0 ng/mL (ref 0.00–0.08)
Troponin i, poc: 0 ng/mL (ref 0.00–0.08)

## 2016-11-06 LAB — D-DIMER, QUANTITATIVE (NOT AT ARMC)

## 2016-11-06 MED ORDER — SODIUM CHLORIDE 0.9 % IV BOLUS (SEPSIS)
1000.0000 mL | Freq: Once | INTRAVENOUS | Status: AC
  Administered 2016-11-06: 1000 mL via INTRAVENOUS

## 2016-11-06 MED ORDER — ALPRAZOLAM 0.25 MG PO TABS: 0.2500 mg | ORAL_TABLET | Freq: Two times a day (BID) | ORAL | 0 refills | Status: DC | PRN
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
## 2016-11-06 NOTE — ED Notes (Signed)
Pt oob to BR with steady gait. 

## 2016-11-06 NOTE — Discharge Planning (Signed)
Arthur Speagle J. Clydene Laming, RN, BSN, [place] [phone]  [facility] set up appointment with Candace Hall, [place] on 6/28@11 :18.  Spoke with pt at bedside and advised to please arrive 15 min early and take a picture ID and your current medications.  Pt verbalizes understanding of keeping appointment.

## 2016-11-06 NOTE — ED Notes (Signed)
Pt returns from xray

## 2016-11-06 NOTE — ED Notes (Signed)
Monitor EKG not printing.

## 2016-11-06 NOTE — Discharge Instructions (Signed)
Take tylenol, motrin as needed.   See [facility] for follow up   Consider stress test if you still have chest pain.   Take xanax as needed for anxiety.   Return to ER if you have severe chest pain, trouble breathing, panic attacks.

## 2016-11-06 NOTE — ED Notes (Signed)
Pt states she understands instructions. Home stable with steady gait. With husband.

## 2016-11-06 NOTE — ED Provider Notes (Signed)
[facility] DEPT Provider Note   CSN: [id number] Arrival date & time: 11/06/16  0740     History   Chief Complaint Chief Complaint  Patient presents with  . Near Syncope  . Dizziness  . Hypertension    HPI Candace Hall is a 56 y.o. female hx of HTN, anxiety, Here presenting with dizziness, chest pain. Patient states that her brother died about 2 weeks ago. She states that he died from presumed cardiac arrest but no autopsy done so she does not know how he passed away. Patient also has been having a lot of stress at work. Patient had some lightheadedness and dizziness and near syncope since yesterday. Also has some chest pressure that radiated to the right jaw. Denies vertigo or room spinning. Patient states that she does not have any medical problems and never had heart attacks in the past. She never had any stress test previously. No recent travel or leg swelling or history of blood clots.  The history is provided by the patient.    Past Medical History:  Diagnosis Date  . Allergy   . Anxiety   . Cataract   . Hypertension     Patient Active Problem List   Diagnosis Date Noted  . Solitary kidney, acquired 12/02/2014  . Menopausal and perimenopausal disorder 05/24/2014  . Fibroid uterus 02/18/2014  . Dysmenorrhea 02/18/2014    Past Surgical History:  Procedure Laterality Date  . BREAST CYST EXCISION    . CORNEAL TRANSPLANT    . KIDNEY SURGERY    . TUBAL LIGATION      OB History    Gravida Para Term Preterm AB Living   4 2 2   2 2    SAB TAB Ectopic Multiple Live Births     2             Home Medications    Prior to Admission medications   Medication Sig Start Date End Date Taking? Authorizing Provider  methocarbamol (ROBAXIN) 500 MG tablet Take 1 tablet (500 mg total) by mouth at bedtime and may repeat dose one time if needed. Patient not taking: Reported on 11/06/2016 04/14/16   Recardo Evangelist, PA-C  omeprazole (PRILOSEC) 20 MG capsule Take 1  capsule (20 mg total) by mouth daily. Patient not taking: Reported on 11/06/2016 04/16/16   Palumbo, April, MD  ondansetron (ZOFRAN ODT) 8 MG disintegrating tablet Take 1 tablet (8 mg total) by mouth every 8 (eight) hours as needed for nausea or vomiting. Patient not taking: Reported on 11/06/2016 12/04/15   Street, Myrtle, PA-C    Family History Family History  Problem Relation Age of Onset  . Hypertension Mother   . Stroke Mother   . Stroke Sister   . Hypertension Brother     Social History Social History  Substance Use Topics  . Smoking status: Never Smoker  . Smokeless tobacco: Never Used  . Alcohol use No     Allergies   Penicillins   Review of Systems Review of Systems  Cardiovascular: Positive for chest pain and near-syncope.  Neurological: Positive for dizziness.  All other systems reviewed and are negative.    Physical Exam Updated Vital Signs BP (!) 144/105   Pulse 63   Temp 97.4 F (36.3 C) (Oral)   Resp 17   Ht 5\' 3"  (1.6 m)   Wt 72.6 kg (160 lb)   LMP 01/31/2014   SpO2 97%   BMI 28.34 kg/m   Physical Exam  Constitutional: She  is oriented to person, place, and time. She appears well-developed.  Anxious   HENT:  Head: Normocephalic.  Right Ear: External ear normal.  Left Ear: External ear normal.  Eyes: Conjunctivae and EOM are normal. Pupils are equal, round, and reactive to light.  Neck: Normal range of motion. Neck supple.  Cardiovascular: Normal rate, regular rhythm and normal heart sounds.   Pulmonary/Chest: Effort normal and breath sounds normal. No respiratory distress. She has no wheezes. She has no rales.  Abdominal: Soft. Bowel sounds are normal.  Musculoskeletal: Normal range of motion. She exhibits no edema, tenderness or deformity.  Neurological: She is alert and oriented to person, place, and time. No cranial nerve deficit. Coordination normal.  Skin: Skin is warm.  Psychiatric: She has a normal mood and affect.  Nursing note  and vitals reviewed.    ED Treatments / Results  Labs (all labs ordered are listed, but only abnormal results are displayed) Labs Reviewed  COMPREHENSIVE METABOLIC PANEL - Abnormal; Notable for the following:       Result Value   Glucose, Bld 111 (*)    Creatinine, Ser 1.01 (*)    Total Protein 6.4 (*)    All other components within normal limits  CBC WITH DIFFERENTIAL/PLATELET  D-DIMER, QUANTITATIVE (NOT AT [facility])  I-STAT TROPOININ, ED  I-STAT TROPOININ, ED    EKG  EKG Interpretation  Date/Time:  Tuesday November 06 2016 08:05:03 EDT Ventricular Rate:  71 PR Interval:    QRS Duration: 95 QT Interval:  383 QTC Calculation: 417 R Axis:   71 Text Interpretation:  Sinus rhythm Low voltage, precordial leads RSR' in V1 or V2, probably normal variant Minimal ST elevation, anterior leads No significant change since last tracing Confirmed by Wandra Arthurs [phone]) on 11/06/2016 8:14:54 AM       Radiology Dg Chest 2 View  Result Date: 11/06/2016 CLINICAL DATA:  Dizziness, near syncope, bilateral jaw and ear pressure. EXAM: CHEST  2 VIEW COMPARISON:  Chest x-ray off April 16, 2016. FINDINGS: The lungs are adequately inflated and clear. The heart and pulmonary vascularity are normal. The mediastinum is normal in width. The trachea is midline. There is no pleural effusion. The bony thorax is unremarkable. IMPRESSION: There is no active cardiopulmonary disease. Electronically Signed   By: Trenae Brunke  [place] M.D.   On: 11/06/2016 09:01    Procedures Procedures (including critical care time)  Medications Ordered in ED Medications  sodium chloride 0.9 % bolus 1,000 mL (1,000 mLs Intravenous New Bag/Given 11/06/16 0816)     Initial Impression / Assessment and Plan / ED Course  I have reviewed the triage vital signs and the nursing notes.  Pertinent labs & imaging results that were available during my care of the patient were reviewed by me and considered in my medical decision making (see  chart for details).     Candace Hall is a 56 y.o. female here with chest pressure, jaw pain, light headedness. Patient's brother died 2 weeks ago from MI. She has no known CAD and never had stress test. I think she seemed anxious about having MI given what happened with her brother. He never had an autopsy so the exact cause of death is unknown. Will get labs, trop x 2, d-dimer.   11:08 AM Trop neg x 2. D-dimer neg. CXR clear. Per husband, she has been having panic attacks recently. Denies suicidal or homicidal ideation. She has no PCP. Will give xanax prn and refer to [facility] for  follow up. If she has persistent chest pain, recommend outpatient stress test.   Final Clinical Impressions(s) / ED Diagnoses   Final diagnoses:  None    New Prescriptions New Prescriptions   No medications on file     Drenda Freeze, MD 11/06/16 1109

## 2016-11-08 ENCOUNTER — Encounter (INDEPENDENT_AMBULATORY_CARE_PROVIDER_SITE_OTHER): Payer: Self-pay | Admitting: Physician Assistant

## 2016-11-08 ENCOUNTER — Ambulatory Visit (INDEPENDENT_AMBULATORY_CARE_PROVIDER_SITE_OTHER): Payer: 59 | Admitting: Physician Assistant

## 2016-11-08 VITALS — BP 141/97 | HR 80 | Temp 98.0°F | Ht 62.0 in | Wt 193.8 lb

## 2016-11-08 DIAGNOSIS — R0789 Other chest pain: Secondary | ICD-10-CM | POA: Diagnosis not present

## 2016-11-08 DIAGNOSIS — F411 Generalized anxiety disorder: Secondary | ICD-10-CM | POA: Diagnosis not present

## 2016-11-08 DIAGNOSIS — R739 Hyperglycemia, unspecified: Secondary | ICD-10-CM

## 2016-11-08 DIAGNOSIS — R5383 Other fatigue: Secondary | ICD-10-CM

## 2016-11-08 LAB — POCT GLYCOSYLATED HEMOGLOBIN (HGB A1C): Hemoglobin A1C: 5.6

## 2016-11-08 NOTE — Progress Notes (Signed)
Subjective:  Patient ID: Candace Hall, female    DOB: 1960-05-23  Age: 56 y.o. MRN: [id number]  CC: f/u anxiety  HPI Candace Hall is a 56 y.o. female with a PMH of anxiety, HTN, cataract, and s/p nephrectomy presents on f/u of ED visit on 11/06/16 for dizziness and chest pain. Diagnosed with "other chest pain" and "anxiety". Pt recently had her brother pass away and has work stress. Trop neg x 2. D-dimer neg. CXR clear. Husband has stated she has been having panic attacks. Was prescribed Xanax. Has not filled Xanax yet because she has not had chest pressure or anxiety attack since the hospital. Still endorses some dizziness and fatigue. Does not have any other complaint or symptoms.     Outpatient Medications Prior to Visit  Medication Sig Dispense Refill  . ALPRAZolam (XANAX) 0.25 MG tablet Take 1 tablet (0.25 mg total) by mouth 2 (two) times daily as needed for anxiety. (Patient not taking: Reported on 11/08/2016) 8 tablet 0  . methocarbamol (ROBAXIN) 500 MG tablet Take 1 tablet (500 mg total) by mouth at bedtime and may repeat dose one time if needed. (Patient not taking: Reported on 11/06/2016) 10 tablet 0  . omeprazole (PRILOSEC) 20 MG capsule Take 1 capsule (20 mg total) by mouth daily. (Patient not taking: Reported on 11/06/2016) 30 capsule 0  . ondansetron (ZOFRAN ODT) 8 MG disintegrating tablet Take 1 tablet (8 mg total) by mouth every 8 (eight) hours as needed for nausea or vomiting. (Patient not taking: Reported on 11/06/2016) 15 tablet 0   No facility-administered medications prior to visit.      ROS Review of Systems  Constitutional: Negative for chills, fever and malaise/fatigue.  Eyes: Negative for blurred vision.  Respiratory: Negative for shortness of breath.   Cardiovascular: Negative for chest pain and palpitations.  Gastrointestinal: Negative for abdominal pain and nausea.  Genitourinary: Negative for dysuria and hematuria.  Musculoskeletal: Negative for joint  pain and myalgias.  Skin: Negative for rash.  Neurological: Negative for tingling and headaches.  Psychiatric/Behavioral: Negative for depression. The patient is not nervous/anxious (not currently).     Objective:  BP (!) 141/97 (BP Location: Left Arm, Patient Position: Sitting, Cuff Size: Large)   Pulse 80   Temp 98 F (36.7 C) (Oral)   Ht 5\' 2"  (1.575 m)   Wt 193 lb 12.8 oz (87.9 kg)   LMP 01/31/2014   SpO2 97%   BMI 35.45 kg/m   BP/Weight 11/08/2016 11/06/2016 37/05/624  Systolic BP 948 546 270  Diastolic BP 97 87 84  Wt. (Lbs) 193.8 160 -  BMI 35.45 28.34 32.01      Physical Exam  Constitutional: She is oriented to person, place, and time.  Well developed, obese, NAD, polite  HENT:  Head: Normocephalic and atraumatic.  Eyes: No scleral icterus.  Neck: Normal range of motion. Neck supple. No thyromegaly present.  Cardiovascular: Normal rate, regular rhythm and normal heart sounds.   Pulmonary/Chest: Effort normal and breath sounds normal. She has no wheezes. She has no rales. She exhibits no tenderness.  Musculoskeletal: She exhibits no edema.  Neurological: She is alert and oriented to person, place, and time. No cranial nerve deficit. Coordination normal.  Skin: Skin is warm and dry. No rash noted. No erythema. No pallor.  Psychiatric: She has a normal mood and affect. Her behavior is normal. Thought content normal.  Vitals reviewed.    Assessment & Plan:   1. Anxiety state - TSH - Ambulatory  referral to Psychiatry - Advised to fill her prescription of Xanax 0.25mg  - Will wait for TSH before sending out SSRI  2. Chest pressure - Ambulatory referral to Cardiology, requesting stress test  3. Hyperglycemia - HgB A1c  4. Fatigue, unspecified type - TSH - CBC with Differential - Ambulatory referral to Cardiology - Recommended Vit D 5,000 IU qday x30 days.   Follow-up: Return in about 4 weeks (around 12/06/2016).   Clent Demark PA

## 2016-11-08 NOTE — Patient Instructions (Signed)

## 2016-11-09 LAB — CBC WITH DIFFERENTIAL/PLATELET
BASOS: 0 %
Basophils Absolute: 0 10*3/uL (ref 0.0–0.2)
EOS (ABSOLUTE): 0.2 10*3/uL (ref 0.0–0.4)
EOS: 2 %
HEMATOCRIT: 40.3 % (ref 34.0–46.6)
Hemoglobin: 13.9 g/dL (ref 11.1–15.9)
IMMATURE GRANULOCYTES: 0 %
Immature Grans (Abs): 0 10*3/uL (ref 0.0–0.1)
Lymphocytes Absolute: 3.5 10*3/uL — ABNORMAL HIGH (ref 0.7–3.1)
Lymphs: 45 %
MCH: 30.6 pg (ref 26.6–33.0)
MCHC: 34.5 g/dL (ref 31.5–35.7)
MCV: 89 fL (ref 79–97)
MONOS ABS: 0.6 10*3/uL (ref 0.1–0.9)
Monocytes: 8 %
NEUTROS ABS: 3.4 10*3/uL (ref 1.4–7.0)
NEUTROS PCT: 45 %
Platelets: 306 10*3/uL (ref 150–379)
RBC: 4.54 x10E6/uL (ref 3.77–5.28)
RDW: 13.8 % (ref 12.3–15.4)
WBC: 7.6 10*3/uL (ref 3.4–10.8)

## 2016-11-09 LAB — TSH: TSH: 2.06 u[IU]/mL (ref 0.450–4.500)

## 2016-11-30 ENCOUNTER — Ambulatory Visit: Payer: 59 | Admitting: Student

## 2016-12-10 ENCOUNTER — Ambulatory Visit (INDEPENDENT_AMBULATORY_CARE_PROVIDER_SITE_OTHER): Payer: 59 | Admitting: Physician Assistant

## 2016-12-27 ENCOUNTER — Encounter (INDEPENDENT_AMBULATORY_CARE_PROVIDER_SITE_OTHER): Payer: Self-pay | Admitting: Physician Assistant

## 2016-12-27 ENCOUNTER — Ambulatory Visit (INDEPENDENT_AMBULATORY_CARE_PROVIDER_SITE_OTHER): Payer: 59 | Admitting: Physician Assistant

## 2016-12-27 VITALS — BP 135/83 | HR 66 | Temp 97.9°F | Wt 192.0 lb

## 2016-12-27 DIAGNOSIS — R002 Palpitations: Secondary | ICD-10-CM | POA: Diagnosis not present

## 2016-12-27 DIAGNOSIS — N951 Menopausal and female climacteric states: Secondary | ICD-10-CM | POA: Diagnosis not present

## 2016-12-27 DIAGNOSIS — R0789 Other chest pain: Secondary | ICD-10-CM | POA: Diagnosis not present

## 2016-12-27 MED ORDER — ASPIRIN EC 81 MG PO TBEC: 81.0000 mg | DELAYED_RELEASE_TABLET | Freq: Every day | ORAL | 3 refills | Status: DC

## 2016-12-27 MED ORDER — AMITRIPTYLINE HCL 25 MG PO TABS: 25.0000 mg | ORAL_TABLET | Freq: Every day | ORAL | 1 refills | Status: DC

## 2016-12-27 NOTE — Patient Instructions (Signed)
Palpitations A palpitation is the feeling that your heartbeat is irregular or is faster than normal. It may feel like your heart is fluttering or skipping a beat. Palpitations are usually not a serious problem. They may be caused by many things, including smoking, caffeine, alcohol, stress, and certain medicines. Although most causes of palpitations are not serious, palpitations can be a sign of a serious medical problem. In some cases, you may need further medical evaluation. Follow these instructions at home: Pay attention to any changes in your symptoms. Take these actions to help with your condition:  Avoid the following: ? Caffeinated coffee, tea, soft drinks, diet pills, and energy drinks. ? Chocolate. ? Alcohol.  Do not use any tobacco products, such as cigarettes, chewing tobacco, and e-cigarettes. If you need help quitting, ask your health care provider.  Try to reduce your stress and anxiety. Things that can help you relax include: ? Yoga. ? Meditation. ? Physical activity, such as swimming, jogging, or walking. ? Biofeedback. This is a method that helps you learn to use your mind to control things in your body, such as your heartbeats.  Get plenty of rest and sleep.  Take over-the-counter and prescription medicines only as told by your health care provider.  Keep all follow-up visits as told by your health care provider. This is important.  Contact a health care provider if:  You continue to have a fast or irregular heartbeat after 24 hours.  Your palpitations occur more often. Get help right away if:  You have chest pain or shortness of breath.  You have a severe headache.  You feel dizzy or you faint. This information is not intended to replace advice given to you by your health care provider. Make sure you discuss any questions you have with your health care provider. Document Released: 04/27/2000 Document Revised: 10/03/2015 Document Reviewed: 01/13/2015 Elsevier  Interactive Patient Education  2017 [employer]  

## 2016-12-27 NOTE — Progress Notes (Signed)
   Subjective:  Patient ID: Candace Hall, female    DOB: 04/22/1961  Age: 56 y.o. MRN: [id number]  CC: f/u anxiety and fatigue  HPI Candace Hall is a 56 y.o. female with a PMH of anxiety, HTN, cataract, and s/p nephrectomy presents for f/u on anxiety and fatigue. Pt recently had her brother pass away and has work stress. She was referred to psychiatry but did not hear from psychiatrist's office. Advised to fill her Xanax 0.25mg  but she did not. CBC and TSH are normal. Patient is currently feeling well but she endorses previous chest pressure, palpitations, orthopnea, PND, presyncope, lightheadedness, vertigo, tingling on head, night sweats, and headache. Denies chest pain, SOB, syncope, claudication, numbness, mental confusion, hallucinations, f/c/n/v, rash, sore throat, or GI/GU sxs. Has an appointment with Cardiology in 4 days.      Outpatient Medications Prior to Visit  Medication Sig Dispense Refill  . ALPRAZolam (XANAX) 0.25 MG tablet Take 1 tablet (0.25 mg total) by mouth 2 (two) times daily as needed for anxiety. (Patient not taking: Reported on 11/08/2016) 8 tablet 0   No facility-administered medications prior to visit.      ROS Review of Systems  Constitutional: Negative for chills, fever and malaise/fatigue.  Eyes: Negative for blurred vision.  Respiratory: Negative for shortness of breath.   Cardiovascular: Positive for palpitations, orthopnea, leg swelling and PND. Negative for chest pain and claudication.       Chest pressure  Gastrointestinal: Negative for abdominal pain and nausea.  Genitourinary: Negative for dysuria and hematuria.  Musculoskeletal: Negative for joint pain and myalgias.  Skin: Negative for rash.  Neurological: Positive for dizziness, tingling and headaches.  Psychiatric/Behavioral: Negative for depression. The patient has insomnia. The patient is not nervous/anxious.     Objective:  BP 135/83 (BP Location: Left Arm, Patient Position:  Sitting, Cuff Size: Large)   Pulse 66   Temp 97.9 F (36.6 C) (Oral)   Wt 192 lb (87.1 kg)   LMP 01/31/2014   SpO2 100%   BMI 35.12 kg/m   BP/Weight 12/27/2016 11/08/2016 11/17/2374  Systolic BP 283 151 761  Diastolic BP 83 97 87  Wt. (Lbs) 192 193.8 160  BMI 35.12 35.45 28.34      Physical Exam  Constitutional: She is oriented to person, place, and time.  Well developed, overweight, NAD, polite  HENT:  Head: Normocephalic and atraumatic.  Eyes: No scleral icterus.  Neck: Normal range of motion. Neck supple.  Cardiovascular: Normal rate, regular rhythm and normal heart sounds.   LLE with trace pitting edema.  Pulmonary/Chest: Effort normal and breath sounds normal.  Musculoskeletal: She exhibits no edema.  Neurological: She is alert and oriented to person, place, and time. No cranial nerve deficit. Coordination normal.  Skin: Skin is warm and dry. No rash noted. No erythema. No pallor.  Psychiatric: She has a normal mood and affect. Her behavior is normal. Thought content normal.  Vitals reviewed.    Assessment & Plan:    1. Palpitations - Aspirin 81 mg tablet  2. Chest pressure - Lipid panel. Rest of labs have been conducted.  3. Climacteric - Amitriptyline 25 mg qhs x30 days     Follow-up: Return in about 4 weeks (around 01/24/2017) for health maintenance.   Clent Demark PA

## 2016-12-28 ENCOUNTER — Other Ambulatory Visit (INDEPENDENT_AMBULATORY_CARE_PROVIDER_SITE_OTHER): Payer: 59

## 2016-12-29 LAB — LIPID PANEL
CHOLESTEROL TOTAL: 171 mg/dL (ref 100–199)
Chol/HDL Ratio: 3.2 ratio (ref 0.0–4.4)
HDL: 53 mg/dL (ref >39)
LDL CALC: 102 mg/dL — AB (ref 0–99)
TRIGLYCERIDES: 79 mg/dL (ref 0–149)
VLDL CHOLESTEROL CAL: 16 mg/dL (ref 5–40)

## 2016-12-30 NOTE — Progress Notes (Signed)
Cardiology Office Note    Date:  12/31/2016   ID:  SYLA DEVOSS, DOB [birth year], MRN [id number]  PCP:  Clent Demark, PA-C  Cardiologist: New to [facility] - Dr. Martinique  Chief Complaint  Patient presents with  . New Patient (Initial Visit)    episodes of chest pain at night; frequent dizzy spells during the day    History of Present Illness:    Candace Hall is a 56 y.o. female with past medical history of Allergies and no prior cardiac history who presents to the office today as a new patient referral for evaluation of chest pain at the request of Domenica Fail, PA-C.   She was recently examined at [facility] ED in 10/2016 for chest pain which radiated to her right jaw. She reported being under increased stress at work along and coping with the loss of her brother. D-dimer along with initial and delta troponin values were negative. EKG was without acute findings. Overall, her symptoms were thought to be most consistent with a panic attack and she was discharged home. She followed-up with her PCP and Cardiology referral was recommended.   In talking with the patient today, she reports episodes of chest tightness which occurs mostly at night after she awakens from sleep in a frightened state. Reports the pain does not wake her from sleep but is present several seconds after she wakes up. She usually walks around her home and the symptoms resolve. She denies any associated dyspnea or palpitations. No exertional chest pain or dyspnea on exertion over the past several months.  Does have occasional dizziness which occurs when bending down to pick something up or turning her head from side to side. She does not check her blood pressure when these episodes occur. BP has been variable over the past several months as her systolics were initially in the 200's at the time of her ED visit in 10/2016 but improved to the 130's without any medications. Systolic pressures today vary from the 130's  to 150's.  She denies any personal history of CAD, Hyperlipidemia or Type II DM. She is a former smoker but quit in 1989. Reports her mother had a CVA in her 45's and her bother recently passed away from a cardiac arrest in 10/2016 at the age of 45.  She works as an Games developer for [employer] She does not exercise regularly but is able to perform ADL's and go grocery shopping without any anginal symptoms.    Past Medical History:  Diagnosis Date  . Allergy   . Anxiety   . Cataract     Past Surgical History:  Procedure Laterality Date  . BREAST CYST EXCISION    . CORNEAL TRANSPLANT    . KIDNEY SURGERY     a. s/p left nephrectomy  . TUBAL LIGATION      Current Medications: Outpatient Medications Prior to Visit  Medication Sig Dispense Refill  . amitriptyline (ELAVIL) 25 MG tablet Take 1 tablet (25 mg total) by mouth at bedtime. 30 tablet 1  . aspirin EC 81 MG tablet Take 1 tablet (81 mg total) by mouth daily. 90 tablet 3   No facility-administered medications prior to visit.      Allergies:   Penicillins   Social History   Social History  . Marital status: Married    Spouse name: N/A  . Number of children: N/A  . Years of education: N/A   Social History Main Topics  . Smoking  status: Former Smoker    Quit date: 05/15/1987  . Smokeless tobacco: Never Used  . Alcohol use No  . Drug use: No  . Sexual activity: Not Asked   Other Topics Concern  . None   Social History Narrative  . None     Family History:  The patient's family history includes Hypertension in her brother and mother; Other (age of onset: 64) in her brother; Stroke (age of onset: 21) in her mother; Ulcers in her father.   Review of Systems:   Please see the history of present illness.     General:  No chills, fever, night sweats or weight changes.  Cardiovascular:  No dyspnea on exertion, edema, orthopnea, palpitations, paroxysmal nocturnal dyspnea. Positive for chest pain.    Dermatological: No rash, lesions/masses Respiratory: No cough, dyspnea Urologic: No hematuria, dysuria Abdominal:   No nausea, vomiting, diarrhea, bright red blood per rectum, melena, or hematemesis Neurologic:  No visual changes, wkns, changes in mental status. Positive for dizziness.   All other systems reviewed and are otherwise negative except as noted above.   Physical Exam:    VS:  BP 130/90   Pulse 64   Ht 5\' 2"  (1.575 m)   Wt 190 lb (86.2 kg)   LMP 01/31/2014   BMI 34.75 kg/m    General: Well developed, well nourished [place] American female appearing in no acute distress. Head: Normocephalic, atraumatic, sclera non-icteric, no xanthomas, nares are without discharge.  Neck: No carotid bruits. JVD not elevated.  Lungs: Respirations regular and unlabored, without wheezes or rales.  Heart: Regular rate and rhythm. No S3 or S4.  No murmur, no rubs, or gallops appreciated. Abdomen: Soft, non-tender, non-distended with normoactive bowel sounds. No hepatomegaly. No rebound/guarding. No obvious abdominal masses. Msk:  Strength and tone appear normal for age. No joint deformities or effusions. Extremities: No clubbing or cyanosis. No lower extremity edema.  Distal pedal pulses are 2+ bilaterally. Neuro: Alert and oriented X 3. Moves all extremities spontaneously. No focal deficits noted. Psych:  Responds to questions appropriately with a normal affect. Skin: No rashes or lesions noted  Wt Readings from Last 3 Encounters:  12/31/16 190 lb (86.2 kg)  12/27/16 192 lb (87.1 kg)  11/08/16 193 lb 12.8 oz (87.9 kg)    Studies/Labs Reviewed:   EKG:  EKG is ordered today. The ekg ordered today demonstrates NSR, HR 64, with no acute ST or T-wave changes when compared to prior tracings.   Recent Labs: 11/06/2016: ALT 22; BUN 9; Creatinine, Ser 1.01; Potassium 4.0; Sodium 138 11/08/2016: Hemoglobin 13.9; Platelets 306; TSH 2.060   Lipid Panel    Component Value Date/Time   CHOL 171  12/27/2016 1626   TRIG 79 12/27/2016 1626   HDL 53 12/27/2016 1626   CHOLHDL 3.2 12/27/2016 1626   LDLCALC 102 (H) 12/27/2016 1626    Additional studies/ records that were reviewed today include:   EKG: 11/06/2016: NSR, HR 71, with no acute ST or T-wave changes.   Assessment:    1. Atypical chest pain   2. Dizziness   3. Transient elevated blood pressure      Plan:   In order of problems listed above:  1. Atypical Chest Pain - she was recently evaluated at [facility] ED in 10/2016 for chest pain which was occurring at rest, lasting for hours, and radiating to her right jaw. EKG was without acute findings and cardiac enzymes remained negative. She reported her brother had just recently passed  away from a cardiac arrest and her symptoms were thought to be secondary to a panic attack. - she denies any recent exertional chest pain or dyspnea on exertion. Does awaken at night in a frightened state and reports having mild chest tightness which starts after she is awake. Improves with walking around her home.  - EKG today is without acute ischemic changes.  - overall, her symptoms do not seem consistent with a cardiac etiology. She does have cardiac risk factors including elevated BP, family history of CAD, and former tobacco use. Will obtain an exercise tolerance test for ischemic evaluation. If low-risk, would not pursue further ischemic evaluation at this time. The patient is new to our practice and the assessment and plan was discussed with Dr. Martinique (DOD) who was in agreement with the above.   2. Dizziness - occurs most frequently when turning her head from side-to-side. No associated chest pain, palpitations, or presyncope.  - most consistent with BPPV. Encouraged the patient to follow-up with her PCP if symptoms persist.   3. Elevated BP - BP significantly elevated with SBP in the 200's in 10/2016 but spontaneously improved to 136/78 while in the ED.  - BP variable at 130/90 to  156/90 when checked in the office today. Rechecked personally and at 142/96. - she dislikes taking medications and wishes to avoid this if possible. I recommended she purchase a BP cuff and check BP regularly at home. If SBP remains > 140 or DBP > 90, would recommend initiation of anti-hypertensive medication. Consider starting Amlodipine 2.5mg  daily and titrate as needed. We reviewed the importance of limiting sodium intake in her diet.     Medication Adjustments/Labs and Tests Ordered: Current medicines are reviewed at length with the patient today.  Concerns regarding medicines are outlined above.  Medication changes, Labs and Tests ordered today are listed in the Patient Instructions below. Patient Instructions  Medication Instructions:  Your physician recommends that you continue on your current medications as directed. Please refer to the Current Medication list given to you today. If you need a refill on your cardiac medications before your next appointment, please call your pharmacy.  Testing/Procedures: Your physician has requested that you have an exercise tolerance test. For further information please visit HugeFiesta[facility][email or web address]. Please also follow instruction sheet, as given.  Follow-Up: Your physician wants you to follow-up in: 12 MONTHS WITH DR Martinique You should receive a reminder letter in the mail two months in advance. If you do not receive a letter, please call our office JUNE 2019 to schedule the AUGUST 2019 follow-up appointment.   Special Instructions: PLEASE PURCHASE BP CUFF AND CALL IF SBP > 140 or DBP > 90  Thank you for choosing [facility] at [facility], [place], [place]  12/31/2016 12:37 PM    [facility], [address] [place], [place]  [address] Phone: [phone]; Fax: [phone]  [address], [place], [place] [address] Phone: [phone]

## 2016-12-31 ENCOUNTER — Telehealth (INDEPENDENT_AMBULATORY_CARE_PROVIDER_SITE_OTHER): Payer: Self-pay | Admitting: *Deleted

## 2016-12-31 ENCOUNTER — Encounter: Payer: Self-pay | Admitting: Student

## 2016-12-31 ENCOUNTER — Ambulatory Visit (INDEPENDENT_AMBULATORY_CARE_PROVIDER_SITE_OTHER): Payer: 59 | Admitting: Student

## 2016-12-31 ENCOUNTER — Other Ambulatory Visit (INDEPENDENT_AMBULATORY_CARE_PROVIDER_SITE_OTHER): Payer: Self-pay | Admitting: Physician Assistant

## 2016-12-31 VITALS — BP 130/90 | HR 64 | Ht 62.0 in | Wt 190.0 lb

## 2016-12-31 DIAGNOSIS — R42 Dizziness and giddiness: Secondary | ICD-10-CM | POA: Diagnosis not present

## 2016-12-31 DIAGNOSIS — R0789 Other chest pain: Secondary | ICD-10-CM | POA: Diagnosis not present

## 2016-12-31 DIAGNOSIS — R030 Elevated blood-pressure reading, without diagnosis of hypertension: Secondary | ICD-10-CM | POA: Diagnosis not present

## 2016-12-31 DIAGNOSIS — E785 Hyperlipidemia, unspecified: Secondary | ICD-10-CM

## 2016-12-31 MED ORDER — PRAVASTATIN SODIUM 20 MG PO TABS: 20.0000 mg | ORAL_TABLET | Freq: Every day | ORAL | 3 refills | Status: DC

## 2016-12-31 NOTE — Telephone Encounter (Signed)
-----   Message from Clent Demark, PA-C sent at 12/31/2016  1:42 PM EDT ----- LDL is mildly elevated. I would recommend a low dose statin. I will send out now.

## 2016-12-31 NOTE — Telephone Encounter (Signed)
MA unable to leave a VM. !!!Please inform patient of pravastatin being sent to the pharmacy for pick up due to cholesterol levels being elevated!!!

## 2016-12-31 NOTE — Patient Instructions (Signed)
Medication Instructions:  Your physician recommends that you continue on your current medications as directed. Please refer to the Current Medication list given to you today. If you need a refill on your cardiac medications before your next appointment, please call your pharmacy.  Testing/Procedures: Your physician has requested that you have an exercise tolerance test. For further information please visit HugeFiesta[facility][email or web address]. Please also follow instruction sheet, as given.  Follow-Up: Your physician wants you to follow-up in: 12 MONTHS WITH DR Martinique You should receive a reminder letter in the mail two months in advance. If you do not receive a letter, please call our office JUNE 2019 to schedule the AUGUST 2019 follow-up appointment.   Special Instructions: PLEASE PURCHASE BP CUFF AND CALL IF BP 140/90   Thank you for choosing [facility] at [facility]!!

## 2017-01-01 ENCOUNTER — Telehealth (HOSPITAL_COMMUNITY): Payer: Self-pay

## 2017-01-01 NOTE — Telephone Encounter (Signed)
Encounter complete. 

## 2017-01-02 ENCOUNTER — Other Ambulatory Visit: Payer: Self-pay | Admitting: Student

## 2017-01-02 DIAGNOSIS — R0789 Other chest pain: Secondary | ICD-10-CM

## 2017-01-03 ENCOUNTER — Inpatient Hospital Stay (HOSPITAL_COMMUNITY): Admission: RE | Admit: 2017-01-03 | Payer: 59 | Source: Ambulatory Visit | Attending: Student | Admitting: Student

## 2017-01-03 ENCOUNTER — Ambulatory Visit (HOSPITAL_COMMUNITY)
Admission: RE | Admit: 2017-01-03 | Discharge: 2017-01-03 | Disposition: A | Discharge status: Home / Self Care | Payer: 59 | Source: Ambulatory Visit | Attending: Cardiology | Admitting: Cardiology

## 2017-01-03 DIAGNOSIS — R0789 Other chest pain: Secondary | ICD-10-CM

## 2017-01-03 DIAGNOSIS — R5383 Other fatigue: Secondary | ICD-10-CM | POA: Diagnosis present

## 2017-01-03 LAB — EXERCISE TOLERANCE TEST
CHL CUP MPHR: 166 {beats}/min
CHL CUP RESTING HR STRESS: 77 {beats}/min
CHL RATE OF PERCEIVED EXERTION: 17
CSEPEDS: 0 s
CSEPHR: 89 %
Estimated workload: 7 METS
Exercise duration (min): 5 min
Peak HR: 148 {beats}/min

## 2017-07-11 ENCOUNTER — Ambulatory Visit (INDEPENDENT_AMBULATORY_CARE_PROVIDER_SITE_OTHER): Payer: 59 | Admitting: Physician Assistant

## 2017-07-11 ENCOUNTER — Encounter (INDEPENDENT_AMBULATORY_CARE_PROVIDER_SITE_OTHER): Payer: Self-pay | Admitting: Physician Assistant

## 2017-07-11 VITALS — BP 138/88 | HR 73 | Temp 98.1°F | Ht 62.0 in

## 2017-07-11 DIAGNOSIS — F411 Generalized anxiety disorder: Secondary | ICD-10-CM

## 2017-07-11 DIAGNOSIS — H938X1 Other specified disorders of right ear: Secondary | ICD-10-CM

## 2017-07-11 DIAGNOSIS — N390 Urinary tract infection, site not specified: Secondary | ICD-10-CM | POA: Diagnosis not present

## 2017-07-11 DIAGNOSIS — Z711 Person with feared health complaint in whom no diagnosis is made: Secondary | ICD-10-CM

## 2017-07-11 DIAGNOSIS — H8111 Benign paroxysmal vertigo, right ear: Secondary | ICD-10-CM | POA: Diagnosis not present

## 2017-07-11 DIAGNOSIS — R8281 Pyuria: Secondary | ICD-10-CM

## 2017-07-11 LAB — POCT URINALYSIS DIPSTICK
Bilirubin, UA: NEGATIVE
Glucose, UA: NEGATIVE
KETONES UA: NEGATIVE
NITRITE UA: NEGATIVE
PH UA: 5.5 (ref 5.0–8.0)
PROTEIN UA: NEGATIVE
Spec Grav, UA: 1.01 (ref 1.010–1.025)
UROBILINOGEN UA: 0.2 U/dL

## 2017-07-11 MED ORDER — ONDANSETRON HCL 4 MG PO TABS: 4.0000 mg | ORAL_TABLET | Freq: Three times a day (TID) | ORAL | 0 refills | Status: DC | PRN

## 2017-07-11 MED ORDER — CIPROFLOXACIN HCL 500 MG PO TABS: 500.0000 mg | ORAL_TABLET | Freq: Two times a day (BID) | ORAL | 0 refills | Status: AC

## 2017-07-11 MED ORDER — ESCITALOPRAM OXALATE 10 MG PO TABS: 10.0000 mg | ORAL_TABLET | Freq: Every day | ORAL | 2 refills | Status: DC

## 2017-07-11 MED ORDER — MECLIZINE HCL 25 MG PO CHEW: 1.0000 | CHEWABLE_TABLET | Freq: Two times a day (BID) | ORAL | 0 refills | Status: DC

## 2017-07-11 NOTE — Progress Notes (Signed)
Subjective:  Patient ID: Candace Hall, female    DOB: December 09, 1960  Age: 57 y.o. MRN: [id number]  CC: dizziness  HPI  Candace Janice Johnsonis a 57 y.o.femalewith a PMH of anxiety, HTN, cataract, and s/p nephrectomy presents for lightheadedness in the morning. Had been seen here six months ago and referred to cardiology which also conducted an ETT. ETT resulted in no evidence for ischemia but did have hypertensive BP response. Pt currently complains of dizziness over the last month. Says she stood up and the room began to spin. She was not ill preceeding the dizziness, no associated loss of audition, no headache. Reports right ear fullness over the last two weeks. Says her anxiety has been increased since having the dizziness. She is also concerned about having darker urine on the first morning urine. No other urinary complaints.    Outpatient Medications Prior to Visit  Medication Sig Dispense Refill  . amitriptyline (ELAVIL) 25 MG tablet Take 1 tablet (25 mg total) by mouth at bedtime. 30 tablet 1  . aspirin EC 81 MG tablet Take 1 tablet (81 mg total) by mouth daily. 90 tablet 3  . pravastatin (PRAVACHOL) 20 MG tablet Take 1 tablet (20 mg total) by mouth daily. 90 tablet 3   No facility-administered medications prior to visit.      ROS Review of Systems  Constitutional: Negative for chills, fever and malaise/fatigue.  HENT:       Right ear fullness  Eyes: Negative for blurred vision.  Respiratory: Negative for shortness of breath.   Cardiovascular: Negative for chest pain and palpitations.  Gastrointestinal: Negative for abdominal pain and nausea.  Genitourinary: Negative for dysuria and hematuria.       Dark morning urine  Musculoskeletal: Negative for joint pain and myalgias.  Skin: Negative for rash.  Neurological: Positive for dizziness. Negative for tingling and headaches.  Psychiatric/Behavioral: Negative for depression. The patient is nervous/anxious.      Objective:  BP 138/88 (BP Location: Left Arm, Patient Position: Sitting, Cuff Size: Large)   Pulse 73   Temp 98.1 F (36.7 C) (Oral)   Ht 5\' 2"  (1.575 m)   LMP 01/31/2014   BMI 34.75 kg/m   BP/Weight 07/11/2017 12/31/2016 10/09/4130  Systolic BP 440 102 725  Diastolic BP 88 90 83  Wt. (Lbs) - 190 192  BMI 34.75 34.75 35.12      Physical Exam  Constitutional: She is oriented to person, place, and time.  Well developed, well nourished, NAD, polite  HENT:  Head: Normocephalic and atraumatic.  TMs normal  Eyes: No scleral icterus.  Neck: Normal range of motion. Neck supple. No thyromegaly present.  Cardiovascular: Normal rate, regular rhythm and normal heart sounds.  Pulmonary/Chest: Effort normal and breath sounds normal.  Musculoskeletal: She exhibits no edema.  Neurological: She is alert and oriented to person, place, and time.  Dix Hallpike maneuver positive on right  Skin: Skin is warm and dry. No rash noted. No erythema. No pallor.  Psychiatric: She has a normal mood and affect. Her behavior is normal. Thought content normal.  Vitals reviewed.    Assessment & Plan:   1. Benign paroxysmal positional vertigo of right ear - Meclizine HCl 25 MG CHEW; Chew 1 tablet (25 mg total) by mouth 2 (two) times daily.  Dispense: 20 each; Refill: 0 - ondansetron (ZOFRAN) 4 MG tablet; Take 1 tablet (4 mg total) by mouth every 8 (eight) hours as needed for nausea or vomiting.  Dispense: 21 tablet;  Refill: 0 - I have personally demonstrated Epley maneuver to patient for repositioning of otolith.   2. Ear fullness, right - CBC with Differential - Comprehensive metabolic panel  3. Generalized anxiety disorder - escitalopram (LEXAPRO) 10 MG tablet; Take 1 tablet (10 mg total) by mouth daily.  Dispense: 30 tablet; Refill: 2  4. Concern about urinary tract disease without diagnosis - Urinalysis Dipstick small blood and large leukocytes  5. Pyuria - Urine Culture - ciprofloxacin  (CIPRO) 500 MG tablet; Take 1 tablet (500 mg total) by mouth 2 (two) times daily for 3 days.  Dispense: 6 tablet; Refill: 0   Meds ordered this encounter  Medications  . Meclizine HCl 25 MG CHEW    Sig: Chew 1 tablet (25 mg total) by mouth 2 (two) times daily.    Dispense:  20 each    Refill:  0    Order Specific Question:   Supervising Provider    Answer:   Tresa Garter [id number]  . ondansetron (ZOFRAN) 4 MG tablet    Sig: Take 1 tablet (4 mg total) by mouth every 8 (eight) hours as needed for nausea or vomiting.    Dispense:  21 tablet    Refill:  0    Order Specific Question:   Supervising Provider    Answer:   Tresa Garter [id number]  . escitalopram (LEXAPRO) 10 MG tablet    Sig: Take 1 tablet (10 mg total) by mouth daily.    Dispense:  30 tablet    Refill:  2    Order Specific Question:   Supervising Provider    Answer:   Tresa Garter [id number]  . ciprofloxacin (CIPRO) 500 MG tablet    Sig: Take 1 tablet (500 mg total) by mouth 2 (two) times daily for 3 days.    Dispense:  6 tablet    Refill:  0    Order Specific Question:   Supervising Provider    Answer:   Tresa Garter [[id number]]    Follow-up: Return in about 6 weeks (around 08/22/2017) for anxiety.   Clent Demark PA

## 2017-07-11 NOTE — Patient Instructions (Addendum)
Benign Positional Vertigo Vertigo is the feeling that you or your surroundings are moving when they are not. Benign positional vertigo is the most common form of vertigo. The cause of this condition is not serious (is benign). This condition is triggered by certain movements and positions (is positional). This condition can be dangerous if it occurs while you are doing something that could endanger you or others, such as driving. What are the causes? In many cases, the cause of this condition is not known. It may be caused by a disturbance in an area of the inner ear that helps your brain to sense movement and balance. This disturbance can be caused by a viral infection (labyrinthitis), head injury, or repetitive motion. What increases the risk? This condition is more likely to develop in:  Women.  People who are 50 years of age or older.  What are the signs or symptoms? Symptoms of this condition usually happen when you move your head or your eyes in different directions. Symptoms may start suddenly, and they usually last for less than a minute. Symptoms may include:  Loss of balance and falling.  Feeling like you are spinning or moving.  Feeling like your surroundings are spinning or moving.  Nausea and vomiting.  Blurred vision.  Dizziness.  Involuntary eye movement (nystagmus).  Symptoms can be mild and cause only slight annoyance, or they can be severe and interfere with daily life. Episodes of benign positional vertigo may return (recur) over time, and they may be triggered by certain movements. Symptoms may improve over time. How is this diagnosed? This condition is usually diagnosed by medical history and a physical exam of the head, neck, and ears. You may be referred to a health care provider who specializes in ear, nose, and throat (ENT) problems (otolaryngologist) or a provider who specializes in disorders of the nervous system (neurologist). You may have additional testing,  including:  MRI.  A CT scan.  Eye movement tests. Your health care provider may ask you to change positions quickly while he or she watches you for symptoms of benign positional vertigo, such as nystagmus. Eye movement may be tested with an electronystagmogram (ENG), caloric stimulation, the Dix-Hallpike test, or the roll test.  An electroencephalogram (EEG). This records electrical activity in your brain.  Hearing tests.  How is this treated? Usually, your health care provider will treat this by moving your head in specific positions to adjust your inner ear back to normal. Surgery may be needed in severe cases, but this is rare. In some cases, benign positional vertigo may resolve on its own in 2-4 weeks. Follow these instructions at home: Safety  Move slowly.Avoid sudden body or head movements.  Avoid driving.  Avoid operating heavy machinery.  Avoid doing any tasks that would be dangerous to you or others if a vertigo episode would occur.  If you have trouble walking or keeping your balance, try using a cane for stability. If you feel dizzy or unstable, sit down right away.  Return to your normal activities as told by your health care provider. Ask your health care provider what activities are safe for you. General instructions  Take over-the-counter and prescription medicines only as told by your health care provider.  Avoid certain positions or movements as told by your health care provider.  Drink enough fluid to keep your urine clear or pale yellow.  Keep all follow-up visits as told by your health care provider. This is important. Contact a health care   provider if:  You have a fever.  Your condition gets worse or you develop new symptoms.  Your family or friends notice any behavioral changes.  Your nausea or vomiting gets worse.  You have numbness or a "pins and needles" sensation. Get help right away if:  You have difficulty speaking or moving.  You are  always dizzy.  You faint.  You develop severe headaches.  You have weakness in your legs or arms.  You have changes in your hearing or vision.  You develop a stiff neck.  You develop sensitivity to light. This information is not intended to replace advice given to you by your health care provider. Make sure you discuss any questions you have with your health care provider. Document Released: 02/05/2006 Document Revised: 10/06/2015 Document Reviewed: 08/23/2014 Elsevier Interactive Patient Education  2018 [employer].    Urinary Tract Infection, Adult A urinary tract infection (UTI) is an infection of any part of the urinary tract. The urinary tract includes the:  Kidneys.  Ureters.  Bladder.  Urethra.  These organs make, store, and get rid of pee (urine) in the body. Follow these instructions at home:  Take over-the-counter and prescription medicines only as told by your doctor.  If you were prescribed an antibiotic medicine, take it as told by your doctor. Do not stop taking the antibiotic even if you start to feel better.  Avoid the following drinks: ? Alcohol. ? Caffeine. ? Tea. ? Carbonated drinks.  Drink enough fluid to keep your pee clear or pale yellow.  Keep all follow-up visits as told by your doctor. This is important.  Make sure to: ? Empty your bladder often and completely. Do not to hold pee for long periods of time. ? Empty your bladder before and after sex. ? Wipe from front to back after a bowel movement if you are female. Use each tissue one time when you wipe. Contact a doctor if:  You have back pain.  You have a fever.  You feel sick to your stomach (nauseous).  You throw up (vomit).  Your symptoms do not get better after 3 days.  Your symptoms go away and then come back. Get help right away if:  You have very bad back pain.  You have very bad lower belly (abdominal) pain.  You are throwing up and cannot keep down any  medicines or water. This information is not intended to replace advice given to you by your health care provider. Make sure you discuss any questions you have with your health care provider. Document Released: 10/17/2007 Document Revised: 10/06/2015 Document Reviewed: 03/21/2015 Elsevier Interactive Patient Education  [employer].

## 2017-07-12 LAB — CBC WITH DIFFERENTIAL/PLATELET
BASOS: 0 %
Basophils Absolute: 0 10*3/uL (ref 0.0–0.2)
EOS (ABSOLUTE): 0.1 10*3/uL (ref 0.0–0.4)
EOS: 1 %
HEMATOCRIT: 42.5 % (ref 34.0–46.6)
Hemoglobin: 14.1 g/dL (ref 11.1–15.9)
IMMATURE GRANS (ABS): 0 10*3/uL (ref 0.0–0.1)
IMMATURE GRANULOCYTES: 0 %
LYMPHS: 39 %
Lymphocytes Absolute: 3.2 10*3/uL — ABNORMAL HIGH (ref 0.7–3.1)
MCH: 30.1 pg (ref 26.6–33.0)
MCHC: 33.2 g/dL (ref 31.5–35.7)
MCV: 91 fL (ref 79–97)
MONOCYTES: 7 %
Monocytes Absolute: 0.5 10*3/uL (ref 0.1–0.9)
Neutrophils Absolute: 4.3 10*3/uL (ref 1.4–7.0)
Neutrophils: 53 %
Platelets: 313 10*3/uL (ref 150–379)
RBC: 4.68 x10E6/uL (ref 3.77–5.28)
RDW: 13.6 % (ref 12.3–15.4)
WBC: 8.1 10*3/uL (ref 3.4–10.8)

## 2017-07-12 LAB — COMPREHENSIVE METABOLIC PANEL
ALT: 26 IU/L (ref 0–32)
AST: 18 IU/L (ref 0–40)
Albumin/Globulin Ratio: 1.5 (ref 1.2–2.2)
Albumin: 4.4 g/dL (ref 3.5–5.5)
Alkaline Phosphatase: 94 IU/L (ref 39–117)
BUN/Creatinine Ratio: 13 (ref 9–23)
BUN: 14 mg/dL (ref 6–24)
Bilirubin Total: 0.2 mg/dL (ref 0.0–1.2)
CALCIUM: 10.3 mg/dL — AB (ref 8.7–10.2)
CO2: 23 mmol/L (ref 20–29)
CREATININE: 1.08 mg/dL — AB (ref 0.57–1.00)
Chloride: 100 mmol/L (ref 96–106)
GFR, EST AFRICAN AMERICAN: 67 mL/min/{1.73_m2} (ref >59)
GFR, EST NON AFRICAN AMERICAN: 58 mL/min/{1.73_m2} — AB (ref >59)
GLOBULIN, TOTAL: 3 g/dL (ref 1.5–4.5)
Glucose: 91 mg/dL (ref 65–99)
Potassium: 4.2 mmol/L (ref 3.5–5.2)
Sodium: 139 mmol/L (ref 134–144)
TOTAL PROTEIN: 7.4 g/dL (ref 6.0–8.5)

## 2017-07-13 LAB — URINE CULTURE

## 2017-07-16 ENCOUNTER — Telehealth (INDEPENDENT_AMBULATORY_CARE_PROVIDER_SITE_OTHER): Payer: Self-pay

## 2017-07-16 NOTE — Telephone Encounter (Signed)
Left patient the following message,  No predominant urinary bacteria, just normal flora. Rest of labs normal. And to call the office with any questions. Nat Christen, CMA

## 2017-07-16 NOTE — Telephone Encounter (Signed)
-----   Message from Clent Demark, PA-C sent at 07/15/2017  1:30 PM EST ----- No predominant urinary bacteria, just normal flora. Rest of labs normal.

## 2017-08-15 ENCOUNTER — Ambulatory Visit (INDEPENDENT_AMBULATORY_CARE_PROVIDER_SITE_OTHER): Payer: 59 | Admitting: Physician Assistant

## 2017-08-22 ENCOUNTER — Encounter: Payer: Self-pay | Admitting: Obstetrics and Gynecology

## 2017-08-22 ENCOUNTER — Ambulatory Visit (INDEPENDENT_AMBULATORY_CARE_PROVIDER_SITE_OTHER): Payer: 59 | Admitting: Obstetrics and Gynecology

## 2017-08-22 VITALS — BP 149/84 | HR 78 | Ht 62.0 in | Wt 187.6 lb

## 2017-08-22 DIAGNOSIS — Z1151 Encounter for screening for human papillomavirus (HPV): Secondary | ICD-10-CM

## 2017-08-22 DIAGNOSIS — Z113 Encounter for screening for infections with a predominantly sexual mode of transmission: Secondary | ICD-10-CM

## 2017-08-22 DIAGNOSIS — Z1231 Encounter for screening mammogram for malignant neoplasm of breast: Secondary | ICD-10-CM | POA: Diagnosis not present

## 2017-08-22 DIAGNOSIS — Z01411 Encounter for gynecological examination (general) (routine) with abnormal findings: Secondary | ICD-10-CM | POA: Diagnosis not present

## 2017-08-22 DIAGNOSIS — R1031 Right lower quadrant pain: Secondary | ICD-10-CM

## 2017-08-22 DIAGNOSIS — G8929 Other chronic pain: Secondary | ICD-10-CM | POA: Diagnosis not present

## 2017-08-22 DIAGNOSIS — Z01419 Encounter for gynecological examination (general) (routine) without abnormal findings: Secondary | ICD-10-CM

## 2017-08-22 DIAGNOSIS — Z124 Encounter for screening for malignant neoplasm of cervix: Secondary | ICD-10-CM | POA: Diagnosis not present

## 2017-08-22 NOTE — Progress Notes (Signed)
Obstetrics and Gynecology Annual Patient Evaluation  Appointment Date: 08/22/2017  OBGYN Clinic: [facility]  Primary Care Provider: Clent Demark  Referring Provider: Clent Demark, PA-C  Chief Complaint:  Chief Complaint  Patient presents with  . Gynecologic Exam    History of Present Illness: Candace Hall is a 57 y.o. African-American [phone] (Patient's last menstrual period was 01/31/2014.), seen for the above chief complaint. Her past medical history is significant for fibroid uterus, right sided hydro  Having some vaginal discharge (no smell or itching) and has had for several years right sided cramping pain. Helped with heating pads and happens every few days and can last for a few seconds to several minutes.     No breast s/s, fevers, chills, chest pain, SOB, nausea, vomiting, abdominal pain, dysuria, hematuria, vaginal itching, dyspareunia, diarrhea, constipation, blood in BMs, vaginal bleeding or spotting since menopause.   Review of Systems:  as noted in the History of Present Illness.  Past Medical History:  Past Medical History:  Diagnosis Date  . Allergy   . Anxiety   . Cataract     Past Surgical History:  Past Surgical History:  Procedure Laterality Date  . BREAST CYST EXCISION    . CESAREAN SECTION    . CORNEAL TRANSPLANT    . KIDNEY SURGERY     a. s/p left nephrectomy  . TUBAL LIGATION      Past Obstetrical History:  OB History  Gravida Para Term Preterm AB Living  4 2 2   2 2   SAB TAB Ectopic Multiple Live Births    2          # Outcome Date GA Lbr Len/2nd Weight Sex Delivery Anes PTL Lv  4 TAB           3 TAB           2 Term      CS-Unspec     1 Term      CS-Unspec       Past Gynecological History: As per HPI. History of Pap Smear(s): Yes.   Last pap 2015, which was NILM History of HRT use: No.  Social History:  Social History   Socioeconomic History  . Marital status:  Married    Spouse name: Not on file  . Number of children: Not on file  . Years of education: Not on file  . Highest education level: Not on file  Occupational History  . Not on file  Social Needs  . Financial resource strain: Not on file  . Food insecurity:    Worry: Not on file    Inability: Not on file  . Transportation needs:    Medical: Not on file    Non-medical: Not on file  Tobacco Use  . Smoking status: Former Smoker    Last attempt to quit: 05/15/1987    Years since quitting: 30.2  . Smokeless tobacco: Never Used  Substance and Sexual Activity  . Alcohol use: No  . Drug use: No  . Sexual activity: Yes  Lifestyle  . Physical activity:    Days per week: Not on file    Minutes per session: Not on file  . Stress: Not on file  Relationships  . Social connections:    Talks on phone: Not on file    Gets together: Not on file    Attends religious service: Not on file    Active member of club or organization:  Not on file    Attends meetings of clubs or organizations: Not on file    Relationship status: Not on file  . Intimate partner violence:    Fear of current or ex partner: Not on file    Emotionally abused: Not on file    Physically abused: Not on file    Forced sexual activity: Not on file  Other Topics Concern  . Not on file  Social History Narrative  . Not on file    Family History:  Family History  Problem Relation Age of Onset  . Hypertension Mother   . Stroke Mother 65  . Hypertension Brother   . Ulcers Father        "Bleeding"  . Other Brother 67       Cardiac Arrest   She denies any female cancers, bleeding or blood clotting disorders.   Medications [name] had no medications administered during this visit. Current Outpatient Medications  Medication Sig Dispense Refill  . escitalopram (LEXAPRO) 10 MG tablet Take 1 tablet (10 mg total) by mouth daily. (Patient not taking: Reported on 08/22/2017) 30 tablet 2  . Meclizine HCl 25 MG  CHEW Chew 1 tablet (25 mg total) by mouth 2 (two) times daily. (Patient not taking: Reported on 08/22/2017) 20 each 0  . ondansetron (ZOFRAN) 4 MG tablet Take 1 tablet (4 mg total) by mouth every 8 (eight) hours as needed for nausea or vomiting. (Patient not taking: Reported on 08/22/2017) 21 tablet 0   No current facility-administered medications for this visit.     Allergies Penicillins   Physical Exam:  BP (!) 149/84 (BP Location: Right Arm, Patient Position: Sitting, Cuff Size: Normal)   Pulse 78   Ht 5\' 2"  (1.575 m)   Wt 187 lb 9.6 oz (85.1 kg)   LMP 01/31/2014   BMI 34.31 kg/m  Body mass index is 34.31 kg/m. General appearance: Well nourished, well developed female in no acute distress.  Neck:  Supple, normal appearance, and no thyromegaly  Cardiovascular: normal s1 and s2.  No murmurs, rubs or gallops. Respiratory:  Clear to auscultation bilateral. Normal respiratory effort Abdomen: positive bowel sounds and no masses, hernias; diffusely non tender to palpation, non distended Breasts: breasts appear normal, no suspicious masses, no skin or nipple changes or axillary nodes, and normal palpation. Neuro/Psych:  Normal mood and affect.  Skin:  Warm and dry.  Lymphatic:  No inguinal lymphadenopathy.   Pelvic exam: is not limited by body habitus EGBUS: within normal limits, Vagina: within normal limits and with no blood or discharge in the vault, Cervix: normal appearing cervix without tenderness, discharge or lesions, +cervical stenosis (unable to pass into external os). Uterus:  nonenlarged and non tender and Adnexa:  normal adnexa and no mass, fullness, tenderness Rectovaginal: deferred  Laboratory: none  Radiology: no new imaging  Assessment: pt doing well  Plan:  1. Encounter for annual routine gynecological examination Pt amenable to mammo screening. Pt had a colonoscopy several years ago but unsure of follow up recommendations and I don't see anything in the computer. I  encouraged to get another one but she'd like to think about it. I told her to call us or her PCP if she changes her mind. 2/28 urine culture negative.  - Cytology - PAP - MM 3D SCREEN BREAST BILATERAL; Future  2. Chronic RLQ pain - US PELVIC COMPLETE WITH TRANSVAGINAL; Future  3. Breast cancer screening by mammogram  - MM 3D SCREEN BREAST BILATERAL;  Future  RTC PRN  Durene Romans MD Attending [facility])

## 2017-08-27 LAB — CYTOLOGY - PAP
BACTERIAL VAGINITIS: POSITIVE — AB
Candida vaginitis: NEGATIVE
Chlamydia: NEGATIVE
Diagnosis: NEGATIVE
HPV (WINDOPATH): NOT DETECTED
NEISSERIA GONORRHEA: NEGATIVE
Trichomonas: NEGATIVE

## 2017-08-28 ENCOUNTER — Other Ambulatory Visit: Payer: Self-pay | Admitting: Obstetrics and Gynecology

## 2017-08-28 MED ORDER — METRONIDAZOLE 500 MG PO TABS: 500.0000 mg | ORAL_TABLET | Freq: Two times a day (BID) | ORAL | 0 refills | Status: AC

## 2017-09-05 ENCOUNTER — Ambulatory Visit (HOSPITAL_COMMUNITY)
Admission: RE | Admit: 2017-09-05 | Discharge: 2017-09-05 | Disposition: A | Discharge status: Home / Self Care | Payer: 59 | Source: Ambulatory Visit | Attending: Obstetrics and Gynecology | Admitting: Obstetrics and Gynecology

## 2017-09-05 DIAGNOSIS — R1031 Right lower quadrant pain: Secondary | ICD-10-CM

## 2017-09-05 DIAGNOSIS — G8929 Other chronic pain: Secondary | ICD-10-CM

## 2017-09-05 DIAGNOSIS — D259 Leiomyoma of uterus, unspecified: Secondary | ICD-10-CM | POA: Insufficient documentation

## 2017-09-11 ENCOUNTER — Telehealth: Payer: Self-pay | Admitting: Obstetrics and Gynecology

## 2017-09-11 NOTE — Telephone Encounter (Signed)
GYN Telephone Note  Patient called at [phone]. I went over the u/s results with her. I told her that her stripe is 32mm and looks normal and I told her in an otherwise asymptomatic postmenopausal woman that no further w/u is needed but with her cramping and her cervical stenosis (so if she was having any bleeding it wouldn't be evident) that I'd recommend going to the OR for a D&C hysteroscopy to evaluate things fully and to make sure we aren't missing something.   Pt to consider her options and let us know. I'll f/u with her next week if I haven't heard about anything by then.  Durene Romans MD Attending [facility] (Faculty Practice) 09/11/2017 Time: [phone]

## 2017-09-12 ENCOUNTER — Ambulatory Visit
Admission: RE | Admit: 2017-09-12 | Discharge: 2017-09-12 | Disposition: A | Discharge status: Home / Self Care | Payer: 59 | Source: Ambulatory Visit | Attending: Obstetrics and Gynecology | Admitting: Obstetrics and Gynecology

## 2017-09-12 DIAGNOSIS — Z01419 Encounter for gynecological examination (general) (routine) without abnormal findings: Secondary | ICD-10-CM

## 2017-09-12 DIAGNOSIS — Z1231 Encounter for screening mammogram for malignant neoplasm of breast: Secondary | ICD-10-CM

## 2017-09-26 ENCOUNTER — Encounter: Payer: Self-pay | Admitting: *Deleted

## 2017-10-21 DIAGNOSIS — R002 Palpitations: Secondary | ICD-10-CM | POA: Diagnosis present

## 2017-10-21 DIAGNOSIS — Z87891 Personal history of nicotine dependence: Secondary | ICD-10-CM | POA: Diagnosis not present

## 2017-10-22 ENCOUNTER — Emergency Department (HOSPITAL_COMMUNITY): Payer: 59

## 2017-10-22 ENCOUNTER — Encounter (HOSPITAL_COMMUNITY): Payer: Self-pay

## 2017-10-22 ENCOUNTER — Emergency Department (HOSPITAL_COMMUNITY)
Admission: EM | Admit: 2017-10-22 | Discharge: 2017-10-22 | Disposition: A | Discharge status: Home / Self Care | Payer: 59 | Attending: Emergency Medicine | Admitting: Emergency Medicine

## 2017-10-22 ENCOUNTER — Other Ambulatory Visit: Payer: Self-pay

## 2017-10-22 DIAGNOSIS — R002 Palpitations: Secondary | ICD-10-CM

## 2017-10-22 LAB — BASIC METABOLIC PANEL
Anion gap: 7 (ref 5–15)
BUN: 19 mg/dL (ref 6–20)
CALCIUM: 9.6 mg/dL (ref 8.9–10.3)
CO2: 25 mmol/L (ref 22–32)
Chloride: 106 mmol/L (ref 101–111)
Creatinine, Ser: 0.97 mg/dL (ref 0.44–1.00)
GFR calc Af Amer: >60 mL/min (ref >60)
GLUCOSE: 114 mg/dL — AB (ref 65–99)
POTASSIUM: 4.1 mmol/L (ref 3.5–5.1)
Sodium: 138 mmol/L (ref 135–145)

## 2017-10-22 LAB — CBC
HEMATOCRIT: 41.1 % (ref 36.0–46.0)
Hemoglobin: 14.3 g/dL (ref 12.0–15.0)
MCH: 30.8 pg (ref 26.0–34.0)
MCHC: 34.8 g/dL (ref 30.0–36.0)
MCV: 88.6 fL (ref 78.0–100.0)
Platelets: 290 10*3/uL (ref 150–400)
RBC: 4.64 MIL/uL (ref 3.87–5.11)
RDW: 12.6 % (ref 11.5–15.5)
WBC: 8.9 10*3/uL (ref 4.0–10.5)

## 2017-10-22 LAB — I-STAT TROPONIN, ED: Troponin i, poc: 0 ng/mL (ref 0.00–0.08)

## 2017-10-22 LAB — TSH: TSH: 2.872 u[IU]/mL (ref 0.350–4.500)

## 2017-10-22 LAB — D-DIMER, QUANTITATIVE: D-Dimer, Quant: <0.27 ug/mL-FEU (ref 0.00–0.50)

## 2017-10-22 NOTE — ED Triage Notes (Signed)
Pt complains of intermittent heart palpitations for two weeks, she states that when this happens she is very dizzy and can't get her balance

## 2017-10-22 NOTE — Discharge Instructions (Addendum)
You were seen today for palpitations.  Your work-up is reassuring including basic lab work, thyroid studies, and screening for blood clots.  You should follow-up with cardiology for Holter monitoring.

## 2017-10-22 NOTE — ED Provider Notes (Signed)
[facility] DEPT Provider Note   CSN: [id number] Arrival date & time: 10/21/17  2257     History   Chief Complaint Chief Complaint  Patient presents with  . Palpitations    HPI Candace Hall is a 57 y.o. female.  HPI  This is a 57 year old female who presents with palpitation and dizziness.  Patient reports intermittent palpitations over the last 2 weeks.  They occur randomly.  She does not associate them with exertion or any specific eliciting factor.  She states that when she has palpitations she feels short of breath and has some chest pressure.  She denies any increase caffeine use, new medications.  Additionally, patient reports that she has persistent lightheaded dizziness.  She denies room spinning dizziness.  She describes it as "feeling like I am going to pass out."  She denies any concerns for dehydration.  She denies chest pain or leg swelling.  No history of blood clots.  No estrogen use.  She denies nausea, vomiting, diarrhea, abdominal pain.  Currently patient is asymptomatic.  Past Medical History:  Diagnosis Date  . Allergy   . Anxiety   . Cataract     Patient Active Problem List   Diagnosis Date Noted  . Dizziness 12/31/2016  . Transient elevated blood pressure 12/31/2016  . Solitary kidney, acquired 12/02/2014  . Menopausal and perimenopausal disorder 05/24/2014  . Fibroid uterus 02/18/2014    Past Surgical History:  Procedure Laterality Date  . BREAST CYST EXCISION    . BREAST EXCISIONAL BIOPSY Right    benign  . CESAREAN SECTION    . CORNEAL TRANSPLANT    . KIDNEY SURGERY     a. s/p left nephrectomy  . TUBAL LIGATION       OB History    Gravida  4   Para  2   Term  2   Preterm      AB  2   Living  2     SAB      TAB  2   Ectopic      Multiple      Live Births               Home Medications    Prior to Admission medications   Medication Sig Start Date End Date Taking? Authorizing  Provider  escitalopram (LEXAPRO) 10 MG tablet Take 1 tablet (10 mg total) by mouth daily. Patient not taking: Reported on 08/22/2017 07/11/17   Clent Demark, PA-C  Meclizine HCl 25 MG CHEW Chew 1 tablet (25 mg total) by mouth 2 (two) times daily. Patient not taking: Reported on 08/22/2017 07/11/17   Clent Demark, PA-C  ondansetron (ZOFRAN) 4 MG tablet Take 1 tablet (4 mg total) by mouth every 8 (eight) hours as needed for nausea or vomiting. Patient not taking: Reported on 08/22/2017 07/11/17   Clent Demark, PA-C    Family History Family History  Problem Relation Age of Onset  . Hypertension Mother   . Stroke Mother 72  . Hypertension Brother   . Ulcers Father        "Bleeding"  . Other Brother 51       Cardiac Arrest    Social History Social History   Tobacco Use  . Smoking status: Former Smoker    Last attempt to quit: 05/15/1987    Years since quitting: 30.4  . Smokeless tobacco: Never Used  Substance Use Topics  . Alcohol use: No  .  Drug use: No     Allergies   Penicillins   Review of Systems Review of Systems  Constitutional: Negative for fever.  Respiratory: Positive for shortness of breath. Negative for cough.   Cardiovascular: Positive for palpitations. Negative for chest pain.  Gastrointestinal: Negative for abdominal pain, nausea and vomiting.  Genitourinary: Negative for dysuria.  Musculoskeletal: Negative for back pain.  Neurological: Positive for light-headedness. Negative for dizziness and headaches.  All other systems reviewed and are negative.    Physical Exam Updated Vital Signs BP (!) 139/96   Pulse (!) 58   Temp 97.8 F (36.6 C) (Oral)   Resp 14   Ht 5\' 2"  (1.575 m)   Wt 83.9 kg (185 lb)   LMP 01/31/2014   SpO2 100%   BMI 33.84 kg/m   Physical Exam  Constitutional: She is oriented to person, place, and time. She appears well-developed and well-nourished.  Overweight, no acute distress  HENT:  Head: Normocephalic and  atraumatic.  Eyes: Pupils are equal, round, and reactive to light.  Neck: Normal range of motion. Neck supple.  Cardiovascular: Normal rate, regular rhythm and normal heart sounds.  No murmur heard. Pulmonary/Chest: Effort normal and breath sounds normal. No respiratory distress. She has no wheezes.  Abdominal: Soft. Bowel sounds are normal. There is no tenderness.  Musculoskeletal: She exhibits no edema or tenderness.  Neurological: She is alert and oriented to person, place, and time.  Cranial nerves II through XII intact, 5 out of 5 strength in all 4 extremities, no dysmetria to finger-nose-finger  Skin: Skin is warm and dry.  Psychiatric: She has a normal mood and affect.  Nursing note and vitals reviewed.    ED Treatments / Results  Labs (all labs ordered are listed, but only abnormal results are displayed) Labs Reviewed  BASIC METABOLIC PANEL - Abnormal; Notable for the following components:      Result Value   Glucose, Bld 114 (*)    All other components within normal limits  CBC  TSH  D-DIMER, QUANTITATIVE (NOT AT [facility])  I-STAT TROPONIN, ED    EKG EKG Interpretation  Date/Time:  Monday October 21 2017 23:04:17 EDT Ventricular Rate:  89 PR Interval:  184 QRS Duration: 88 QT Interval:  372 QTC Calculation: 452 R Axis:   133 Text Interpretation:  Normal sinus rhythm Right axis deviation Cannot rule out Anterior infarct , age undetermined Abnormal ECG Confirmed by Thayer Jew ([id number]) on 10/22/2017 2:59:54 AM   Radiology Dg Chest 2 View  Result Date: 10/22/2017 CLINICAL DATA:  Palpitations.  Shortness of breath. EXAM: CHEST - 2 VIEW COMPARISON:  11/06/2016 FINDINGS: The cardiomediastinal contours are normal. The lungs are clear. Pulmonary vasculature is normal. No consolidation, pleural effusion, or pneumothorax. No acute osseous abnormalities are seen. IMPRESSION: No acute pulmonary process. Electronically Signed   By: Jeb Levering M.D.   On: 10/22/2017 01:32     Procedures Procedures (including critical care time)  Medications Ordered in ED Medications - No data to display   Initial Impression / Assessment and Plan / ED Course  I have reviewed the triage vital signs and the nursing notes.  Pertinent labs & imaging results that were available during my care of the patient were reviewed by me and considered in my medical decision making (see chart for details).     Patient presents with palpitations and dizziness.  Currently asymptomatic and nontoxic appearing.  Neurologically intact.  Vital signs reassuring.  EKG shows sinus rhythm.  No evidence  of arrhythmia or ischemia.  Lab work reviewed.  No metabolic derangement.  Troponin negative.  TSH and screening d-dimer were also sent to evaluate for thyroid dysfunction and possible PE.  These are reassuring.  Chest x-ray shows no evidence of effusion, pneumonia, pneumothorax.  Patient has been asymptomatic while in the emergency room.  I discussed with patient that she would likely benefit from cardiology follow-up and Holter monitoring.  Patient stated understanding.  After history, exam, and medical workup I feel the patient has been appropriately medically screened and is safe for discharge home. Pertinent diagnoses were discussed with the patient. Patient was given return precautions.   Final Clinical Impressions(s) / ED Diagnoses   Final diagnoses:  Palpitations    ED Discharge Orders    None       Dayln Tugwell, Barbette Hair, MD 10/22/17 [phone]

## 2017-10-22 NOTE — ED Notes (Signed)
Lab called, will add on TSH and D-Dimer to existing blood work.

## 2017-10-23 ENCOUNTER — Encounter: Payer: Self-pay | Admitting: Cardiology

## 2017-10-23 ENCOUNTER — Ambulatory Visit (INDEPENDENT_AMBULATORY_CARE_PROVIDER_SITE_OTHER): Payer: 59 | Admitting: Cardiology

## 2017-10-23 VITALS — BP 126/94 | HR 72 | Ht 62.0 in | Wt 186.0 lb

## 2017-10-23 DIAGNOSIS — R002 Palpitations: Secondary | ICD-10-CM | POA: Diagnosis not present

## 2017-10-23 DIAGNOSIS — R0600 Dyspnea, unspecified: Secondary | ICD-10-CM

## 2017-10-23 NOTE — Progress Notes (Signed)
Cardiology Office Note    Date:  10/23/2017   ID:  Candace Hall, DOB 09-18-1960, MRN [id number]  PCP:  Clent Demark, PA-C  Cardiologist:  Dr. Martinique  Chief Complaint  Patient presents with  . Palpitations    History of Present Illness:    Candace Hall is a 57 y.o. female with history of HTN seen for follow up of chest pain and palpitations.  She was recently examined at [facility] ED in 10/2016 for chest pain which radiated to her right jaw. She reported being under increased stress at work along and coping with the loss of her brother. D-dimer along with initial and delta troponin values were negative. EKG was without acute findings. Overall, her symptoms were thought to be most consistent with a panic attack and she was discharged home.   She was seen in follow up by [place] PA-C and ETT was arranged. This was negative for ischemia but she did have a hypertensive BP response.   She was seen in the ED yesterday with complaints of palpitations associated with some dyspnea and chest pressure. Evaluation in ED unremarkable and cardiac follow up advised.   She denies any personal history of CAD, Hyperlipidemia or Type II DM. She is a former smoker but quit in 1989. Reports her mother had a CVA in her 19's and her bother recently passed away from a cardiac arrest in 10/2016 at the age of 40.  She works as an Games developer for [employer] She does not exercise regularly but walks a fair amount at work. She notes that for the past 5-6 weeks she has palpitations that may last 30-120 minutes. Feels heart skipping. Feels SOB. Also has dizziness even when taking a shower. Symptoms occur every 4-5 days. Does not use caffeine or decongestants.   Past Medical History:  Diagnosis Date  . Allergy   . Anxiety   . Cataract     Past Surgical History:  Procedure Laterality Date  . BREAST CYST EXCISION    . BREAST EXCISIONAL BIOPSY Right    benign  .  CESAREAN SECTION    . CORNEAL TRANSPLANT    . KIDNEY SURGERY     a. s/p left nephrectomy  . TUBAL LIGATION      Current Medications: Outpatient Medications Prior to Visit  Medication Sig Dispense Refill  . escitalopram (LEXAPRO) 10 MG tablet Take 1 tablet (10 mg total) by mouth daily. (Patient not taking: Reported on 10/23/2017) 30 tablet 2  . Meclizine HCl 25 MG CHEW Chew 1 tablet (25 mg total) by mouth 2 (two) times daily. (Patient not taking: Reported on 10/23/2017) 20 each 0  . ondansetron (ZOFRAN) 4 MG tablet Take 1 tablet (4 mg total) by mouth every 8 (eight) hours as needed for nausea or vomiting. (Patient not taking: Reported on 10/23/2017) 21 tablet 0   No facility-administered medications prior to visit.      Allergies:   Penicillins   Social History   Socioeconomic History  . Marital status: Married    Spouse name: Not on file  . Number of children: Not on file  . Years of education: Not on file  . Highest education level: Not on file  Occupational History  . Not on file  Social Needs  . Financial resource strain: Not on file  . Food insecurity:    Worry: Not on file    Inability: Not on file  . Transportation needs:    Medical:  Not on file    Non-medical: Not on file  Tobacco Use  . Smoking status: Former Smoker    Last attempt to quit: 05/15/1987    Years since quitting: 30.4  . Smokeless tobacco: Never Used  Substance and Sexual Activity  . Alcohol use: No  . Drug use: No  . Sexual activity: Yes  Lifestyle  . Physical activity:    Days per week: Not on file    Minutes per session: Not on file  . Stress: Not on file  Relationships  . Social connections:    Talks on phone: Not on file    Gets together: Not on file    Attends religious service: Not on file    Active member of club or organization: Not on file    Attends meetings of clubs or organizations: Not on file    Relationship status: Not on file  Other Topics Concern  . Not on file  Social  History Narrative  . Not on file     Family History:  The patient's family history includes Hypertension in her brother and mother; Other (age of onset: 68) in her brother; Stroke (age of onset: 6) in her mother; Ulcers in her father.   Review of Systems:   Please see the history of present illness.     All other systems reviewed and are otherwise negative except as noted above.   Physical Exam:    VS:  BP (!) 126/94   Pulse 72   Ht 5\' 2"  (1.575 m)   Wt 186 lb (84.4 kg)   LMP 01/31/2014   SpO2 98%   BMI 34.02 kg/m    GENERAL:  Well appearing BF in NAD HEENT:  PERRL, EOMI, sclera are clear. Oropharynx is clear. NECK:  No jugular venous distention, carotid upstroke brisk and symmetric, no bruits, no thyromegaly or adenopathy LUNGS:  Clear to auscultation bilaterally CHEST:  Unremarkable HEART:  RRR,  PMI not displaced or sustained,S1 and S2 within normal limits, no S3, no S4: no clicks, no rubs, no murmurs ABD:  Soft, nontender. BS +, no masses or bruits. No hepatomegaly, no splenomegaly EXT:  2 + pulses throughout, no edema, no cyanosis no clubbing SKIN:  Warm and dry.  No rashes NEURO:  Alert and oriented x 3. Cranial nerves II through XII intact. PSYCH:  Cognitively intact   Wt Readings from Last 3 Encounters:  10/23/17 186 lb (84.4 kg)  10/22/17 185 lb (83.9 kg)  08/22/17 187 lb 9.6 oz (85.1 kg)    Studies/Labs Reviewed:   Recent Labs: 07/11/2017: ALT 26 10/22/2017: BUN 19; Creatinine, Ser 0.97; Hemoglobin 14.3; Platelets 290; Potassium 4.1; Sodium 138; TSH 2.872   Lipid Panel    Component Value Date/Time   CHOL 171 12/27/2016 1626   TRIG 79 12/27/2016 1626   HDL 53 12/27/2016 1626   CHOLHDL 3.2 12/27/2016 1626   LDLCALC 102 (H) 12/27/2016 1626    Additional studies/ records that were reviewed today include:   EKG: 6/102019: NSR, RAD. No acute changes. I have personally reviewed and interpreted this study.   ETT 01/03/17: Study Highlights     Blood  pressure demonstrated a hypertensive response to exercise.  There was no ST segment deviation noted during stress.   1. Below average exercise tolerance.  2. No evidence for ischemia by ST segment analysis.  3. Hypertensive BP response.      Assessment:    1. Palpitations   2. Dyspnea, unspecified type  Plan:   In order of problems listed above:  1. Palpitations. Intermittent. Labs and Ecg ok. Symptoms sporadic. Will have her wear an event monitor to document arrhythmia.  2. Dyspnea with and without exertion - recent CXR normal - will order Echo.  3. Elevated BP - BP mildly elevated today. Will monitor with lifestyle modification. If she does have arrhythmia a beta blocker may be indicated.    Signed, Kristl Morioka Martinique, MD  10/23/2017 3:00 PM    [place] [address] [place], [place] [address] Phone: [phone]

## 2017-10-23 NOTE — Patient Instructions (Signed)
Schedule Echo and 30 day event monitor

## 2017-10-29 ENCOUNTER — Emergency Department (HOSPITAL_COMMUNITY): Payer: 59

## 2017-10-29 ENCOUNTER — Emergency Department (HOSPITAL_COMMUNITY)
Admission: EM | Admit: 2017-10-29 | Discharge: 2017-10-29 | Disposition: A | Discharge status: Home / Self Care | Payer: 59 | Attending: Physician Assistant | Admitting: Physician Assistant

## 2017-10-29 ENCOUNTER — Encounter (HOSPITAL_COMMUNITY): Payer: Self-pay | Admitting: Emergency Medicine

## 2017-10-29 ENCOUNTER — Other Ambulatory Visit: Payer: Self-pay

## 2017-10-29 DIAGNOSIS — Z87891 Personal history of nicotine dependence: Secondary | ICD-10-CM | POA: Insufficient documentation

## 2017-10-29 DIAGNOSIS — R42 Dizziness and giddiness: Secondary | ICD-10-CM | POA: Diagnosis not present

## 2017-10-29 DIAGNOSIS — Z91040 Latex allergy status: Secondary | ICD-10-CM | POA: Diagnosis not present

## 2017-10-29 LAB — CBC WITH DIFFERENTIAL/PLATELET
ABS IMMATURE GRANULOCYTES: 0 10*3/uL (ref 0.0–0.1)
BASOS PCT: 0 %
Basophils Absolute: 0 10*3/uL (ref 0.0–0.1)
EOS ABS: 0.2 10*3/uL (ref 0.0–0.7)
Eosinophils Relative: 2 %
HEMATOCRIT: 40.3 % (ref 36.0–46.0)
Hemoglobin: 13.2 g/dL (ref 12.0–15.0)
IMMATURE GRANULOCYTES: 0 %
LYMPHS ABS: 3.6 10*3/uL (ref 0.7–4.0)
Lymphocytes Relative: 50 %
MCH: 29.7 pg (ref 26.0–34.0)
MCHC: 32.8 g/dL (ref 30.0–36.0)
MCV: 90.6 fL (ref 78.0–100.0)
MONO ABS: 0.6 10*3/uL (ref 0.1–1.0)
MONOS PCT: 8 %
NEUTROS ABS: 3 10*3/uL (ref 1.7–7.7)
Neutrophils Relative %: 40 %
PLATELETS: 271 10*3/uL (ref 150–400)
RBC: 4.45 MIL/uL (ref 3.87–5.11)
RDW: 12.3 % (ref 11.5–15.5)
WBC: 7.3 10*3/uL (ref 4.0–10.5)

## 2017-10-29 LAB — I-STAT TROPONIN, ED: TROPONIN I, POC: 0.01 ng/mL (ref 0.00–0.08)

## 2017-10-29 LAB — BASIC METABOLIC PANEL
Anion gap: 5 (ref 5–15)
BUN: 12 mg/dL (ref 6–20)
CO2: 27 mmol/L (ref 22–32)
CREATININE: 0.79 mg/dL (ref 0.44–1.00)
Calcium: 9.8 mg/dL (ref 8.9–10.3)
Chloride: 108 mmol/L (ref 101–111)
GFR calc Af Amer: >60 mL/min (ref >60)
GLUCOSE: 97 mg/dL (ref 65–99)
Potassium: 4.7 mmol/L (ref 3.5–5.1)
SODIUM: 140 mmol/L (ref 135–145)

## 2017-10-29 NOTE — ED Provider Notes (Signed)
[facility] EMERGENCY DEPARTMENT Provider Note   CSN: [id number] Arrival date & time: 10/29/17  1636     History   Chief Complaint Chief Complaint  Patient presents with  . Dizziness    HPI Candace Hall is a 57 y.o. female.  Patient presents to the emergency department with chief complaint of dizziness.  She states that she has been feeling dizzy for the past couple of months.  She states that her symptoms are intermittent.  Reports that the symptoms came on quite severely today.  They have resolved now.  She states that her symptoms are worsened with head movement.  She reports that today she had some tingling on the left side of her face, but denies any numbness, or weakness of her extremities.  Denies any slurred speech or vision changes.  She denies any fever, chills, chest pain, or shortness of breath.  Denies any recent illness.  She reports being seen recently for heart palpitations, and was referred to a cardiologist to get a Holter monitor.  The history is provided by the patient. No language interpreter was used.    Past Medical History:  Diagnosis Date  . Allergy   . Anxiety   . Cataract     Patient Active Problem List   Diagnosis Date Noted  . Dizziness 12/31/2016  . Transient elevated blood pressure 12/31/2016  . Solitary kidney, acquired 12/02/2014  . Menopausal and perimenopausal disorder 05/24/2014  . Fibroid uterus 02/18/2014    Past Surgical History:  Procedure Laterality Date  . BREAST CYST EXCISION    . BREAST EXCISIONAL BIOPSY Right    benign  . CESAREAN SECTION    . CORNEAL TRANSPLANT    . KIDNEY SURGERY     a. s/p left nephrectomy  . TUBAL LIGATION       OB History    Gravida  4   Para  2   Term  2   Preterm      AB  2   Living  2     SAB      TAB  2   Ectopic      Multiple      Live Births               Home Medications    Prior to Admission medications   Not on File    Family  History Family History  Problem Relation Age of Onset  . Hypertension Mother   . Stroke Mother 50  . Hypertension Brother   . Ulcers Father        "Bleeding"  . Other Brother 76       Cardiac Arrest    Social History Social History   Tobacco Use  . Smoking status: Former Smoker    Last attempt to quit: 05/15/1987    Years since quitting: 30.4  . Smokeless tobacco: Never Used  Substance Use Topics  . Alcohol use: No  . Drug use: No     Allergies   Latex and Penicillins   Review of Systems Review of Systems  All other systems reviewed and are negative.    Physical Exam Updated Vital Signs BP 108/86   Pulse 69   Temp 98.2 F (36.8 C) (Oral)   Resp 14   Ht 5\' 2"  (1.575 m)   Wt 83.9 kg (185 lb)   LMP 01/31/2014   SpO2 97%   BMI 33.84 kg/m   Physical Exam  Constitutional: She is  oriented to person, place, and time. She appears well-developed and well-nourished.  HENT:  Head: Normocephalic and atraumatic.  Eyes: Pupils are equal, round, and reactive to light. Conjunctivae and EOM are normal.  Neck: Normal range of motion. Neck supple.  Cardiovascular: Normal rate and regular rhythm. Exam reveals no gallop and no friction rub.  No murmur heard. Pulmonary/Chest: Effort normal and breath sounds normal. No respiratory distress. She has no wheezes. She has no rales. She exhibits no tenderness.  Abdominal: Soft. Bowel sounds are normal. She exhibits no distension and no mass. There is no tenderness. There is no rebound and no guarding.  Musculoskeletal: Normal range of motion. She exhibits no edema or tenderness.  Neurological: She is alert and oriented to person, place, and time.  CN III-XII intact, speech is clear, movements are goal oriented, normal finger-to-nose, no pronator drift incision and strength of upper and lower extremities 5/5  Skin: Skin is warm and dry.  Psychiatric: She has a normal mood and affect. Her behavior is normal. Judgment and thought  content normal.  Nursing note and vitals reviewed.    ED Treatments / Results  Labs (all labs ordered are listed, but only abnormal results are displayed) Labs Reviewed  BASIC METABOLIC PANEL  CBC WITH DIFFERENTIAL/PLATELET  I-STAT TROPONIN, ED    EKG None  Radiology Dg Chest 2 View  Result Date: 10/29/2017 CLINICAL DATA:  57 year old female with shortness of breath, dizziness and lightheadedness. Progressive symptoms today. Former smoker. EXAM: CHEST - 2 VIEW COMPARISON:  10/22/2017 and earlier. FINDINGS: Lung volumes and mediastinal contours are stable and within normal limits. Visualized tracheal air column is within normal limits. No pneumothorax, pulmonary edema, pleural effusion or confluent pulmonary opacity. Stable left abdominal surgical clips. Negative visible bowel gas pattern. No acute osseous abnormality identified. IMPRESSION: Negative.  No acute cardiopulmonary abnormality. Electronically Signed   By: Genevie Ann M.D.   On: 10/29/2017 18:02   Ct Head Wo Contrast  Result Date: 10/29/2017 CLINICAL DATA:  Dizziness 2 months with right base tingling today. EXAM: CT HEAD WITHOUT CONTRAST TECHNIQUE: Contiguous axial images were obtained from the base of the skull through the vertex without intravenous contrast. COMPARISON:  12/04/2015 FINDINGS: Brain: No evidence of acute infarction, hemorrhage, hydrocephalus, extra-axial collection or mass lesion/mass effect. Vascular: No hyperdense vessel or unexpected calcification. Skull: Normal. Negative for fracture or focal lesion. Sinuses/Orbits: Orbits are normal. There is mild opacification over the ethmoid air cells. Mastoid air cells are clear. Other: None. IMPRESSION: No acute findings. Minimal chronic sinus inflammatory change. Electronically Signed   By: Marin Olp M.D.   On: 10/29/2017 18:09    Procedures Procedures (including critical care time)  Medications Ordered in ED Medications - No data to display   Initial Impression  / Assessment and Plan / ED Course  I have reviewed the triage vital signs and the nursing notes.  Pertinent labs & imaging results that were available during my care of the patient were reviewed by me and considered in my medical decision making (see chart for details).     Patient with dizziness x2 months.  Symptoms are intermittent.  Reports associated heart palpitations.  EKG shows no concerning arrhythmias.  Vital signs are stable.  Patient is neurovascularly intact.  I recommended to the patient that she have an MRI of her brain today to rule out cerebellar stroke, however patient declined and states that she cannot wait hours for an MRI.  She wishes to be discharged.  She has  capacity to make her own medical decisions.  I have advised her to follow-up with both her primary care doctor and with a neurologist and to return if her symptoms change or worsen.  Patient understands and agrees with this plan.  Final Clinical Impressions(s) / ED Diagnoses   Final diagnoses:  Dizziness    ED Discharge Orders        Ordered    Ambulatory referral to Neurology    Comments:  An appointment is requested in approximately: 1   10/29/17 2305       Montine Circle, PA-C 10/29/17 2306    Drenda Freeze, MD 10/29/17 2329

## 2017-10-29 NOTE — ED Triage Notes (Addendum)
Per [facility]: Patient to ED c/o sudden onset dizziness while walking at work about an hour ago. Patient reports she felt tingling to R side of forehead shortly after dizziness came on. Patient seen last week at [facility] for dizziness as well; has hx anxiety and per EMS, appeared very anxious on scene. Hx HTN, but patient does not take medication for it. Initial BP 168/116 - 134/94 en route. No facial droop, no speech or vision changes, no extremity weakness. Grip strength equal bilaterally, PERRL. Patient does report recent hx palpitations and has f/u appointment with Cardiology on Friday in which she's supposed to wear Halter monitor. EMS VS: HR 72 NSR, 98% RA, CBG 93. A&O x 4. In no apparent distress.

## 2017-10-29 NOTE — Discharge Instructions (Signed)
You have elected to leave the ER without completing the workup which includes an MRI of your brain.  This puts you at an increased risk of complications from possible stroke. Ask your doctor to order an MRI of your brain.  Return to the emergency department if your symptoms change or worsen.

## 2017-10-29 NOTE — ED Provider Notes (Signed)
Patient placed in Quick Look pathway, seen and evaluated   Chief Complaint: dizziness  HPI:   Patient presents to ED for evaluation of sudden onset dizziness, right sided facial "tingling" that began prior to arrival while speaking to her coworker. She denies any slurred speech or weakness. She has been having dizziness for the past several months and is frustrated that her symptoms have not been explained.  She was evaluated in the ED last week for palpitations. She is scheduled for f/u with cardiology this week for Holter monitoring. She denies any injuries or falls. She also reports onset of chest pain, SOB during the episode. Reports history of similar symptoms in the past. EMS reports feeling anxious.  ROS: dizziness  Physical Exam:   Gen: No distress  Neuro: Awake and Alert  Skin: Warm    Focused Exam: PERRL. Equal grip strength bilaterally. Strength 5/5 in bilateral upper and lower extremities. Normal speech noted. No facial asymmetry noted. Sensation intact to light touch to face. RRR. Lungs CTAB.   Initiation of care has begun. The patient has been counseled on the process, plan, and necessity for staying for the completion/evaluation, and the remainder of the medical screening examination    Delia Heady, PA-C 10/29/17 1704    Macarthur Critchley, MD 10/29/17 2356

## 2017-11-01 ENCOUNTER — Ambulatory Visit (INDEPENDENT_AMBULATORY_CARE_PROVIDER_SITE_OTHER): Payer: 59

## 2017-11-01 ENCOUNTER — Other Ambulatory Visit: Payer: Self-pay

## 2017-11-01 ENCOUNTER — Ambulatory Visit (HOSPITAL_COMMUNITY): Payer: 59 | Attending: Cardiovascular Disease

## 2017-11-01 DIAGNOSIS — R002 Palpitations: Secondary | ICD-10-CM | POA: Diagnosis not present

## 2017-11-01 DIAGNOSIS — I517 Cardiomegaly: Secondary | ICD-10-CM | POA: Diagnosis not present

## 2017-11-01 DIAGNOSIS — Z87891 Personal history of nicotine dependence: Secondary | ICD-10-CM | POA: Insufficient documentation

## 2017-11-01 DIAGNOSIS — R0600 Dyspnea, unspecified: Secondary | ICD-10-CM | POA: Diagnosis not present

## 2017-11-05 ENCOUNTER — Encounter: Payer: Self-pay | Admitting: Neurology

## 2017-11-05 ENCOUNTER — Ambulatory Visit: Payer: 59 | Admitting: Neurology

## 2017-11-05 VITALS — BP 125/80 | HR 75 | Ht 62.0 in | Wt 186.0 lb

## 2017-11-05 DIAGNOSIS — H81399 Other peripheral vertigo, unspecified ear: Secondary | ICD-10-CM

## 2017-11-05 DIAGNOSIS — H918X1 Other specified hearing loss, right ear: Secondary | ICD-10-CM

## 2017-11-05 DIAGNOSIS — R42 Dizziness and giddiness: Secondary | ICD-10-CM | POA: Diagnosis not present

## 2017-11-05 MED ORDER — MECLIZINE HCL 25 MG PO TABS: 25.0000 mg | ORAL_TABLET | Freq: Three times a day (TID) | ORAL | 0 refills | Status: DC | PRN

## 2017-11-05 MED ORDER — DIAZEPAM 2 MG PO TABS: 2.0000 mg | ORAL_TABLET | Freq: Four times a day (QID) | ORAL | 0 refills | Status: DC | PRN

## 2017-11-05 NOTE — Progress Notes (Signed)
PATIENT: Candace Hall DOB: 1960-05-28  Chief Complaint  Patient presents with  . New Patient (Initial Visit)    Referred by Montine Circle, PA for dizziness.  . Dizziness    Pt felt dizzy, on her right side, going from laying to sitting and sitting to standing.      HISTORICAL  Candace Hall is a 57 years old female, seen in refer by her primary care PA  Clent Demark, for evaluation of dizziness, initial evaluation was on November 06, 2017.  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  She had acute onset vertigo since April 2019, she was taking a shower, no have sudden onset of room spinning, loud tinnitus, right ear muffling sensation, she has to ask her husband to help her out, over the past couple months, she had a constant tinnitus at the right side, decreased hearing on the right side, sensitivity to sound high-pitched sound at right ear, with sudden positional change, she still has transient dizziness, she also complains of unsteadiness  She had similar episode in 2018, sudden onset vertigo, but short lasting, with intense vertigo, but no balance difficulties,  CT head without contrast on October 29, 2017 was normal,  Laboratory evaluation showed normal CBC, BMP, TSH, negative troponin, d-dimer  REVIEW OF SYSTEMS: Full 14 system review of systems performed and notable only for insomnia, dizziness, confusion, feeling hot, shortness of breath, fatigue, palpitation, spinning sensation  ALLERGIES: Allergies  Allergen Reactions  . Latex Anaphylaxis  . Penicillins Anaphylaxis    Has patient had a PCN reaction causing immediate rash, facial/tongue/throat swelling, SOB or lightheadedness with hypotension: yes Has patient had a PCN reaction causing severe rash involving mucus membranes or skin necrosis: no Has patient had a PCN reaction that required  hospitalization: yes Has patient had a PCN reaction occurring within the last 10 years: no If all of the above answers are "NO", then may proceed with Cephalosporin use.     HOME MEDICATIONS: No current outpatient medications on file.   No current facility-administered medications for this visit.     PAST MEDICAL HISTORY: Past Medical History:  Diagnosis Date  . Allergy   . Anxiety   . Cataract     PAST SURGICAL HISTORY: Past Surgical History:  Procedure Laterality Date  . BREAST CYST EXCISION    . BREAST EXCISIONAL BIOPSY Right    benign  . CESAREAN SECTION    . CORNEAL TRANSPLANT    . KIDNEY SURGERY     a. s/p left nephrectomy  . TUBAL LIGATION      FAMILY HISTORY: Family History  Problem Relation Age of Onset  . Hypertension Mother   . Stroke Mother 3  . Hypertension Brother   . Ulcers Father        "Bleeding"  . Other Brother 23       Cardiac Arrest    SOCIAL HISTORY:  Social History   Socioeconomic History  . Marital status: Married    Spouse name: Not on file  . Number of children: Not on file  . Years of education: Not on file  . Highest education level: Not on file  Occupational History  . Not on file  Social Needs  . Financial resource strain: Not on file  . Food insecurity:    Worry: Not on file    Inability: Not on file  .  Transportation needs:    Medical: Not on file    Non-medical: Not on file  Tobacco Use  . Smoking status: Former Smoker    Last attempt to quit: 05/15/1987    Years since quitting: 30.4  . Smokeless tobacco: Never Used  Substance and Sexual Activity  . Alcohol use: No  . Drug use: No  . Sexual activity: Yes  Lifestyle  . Physical activity:    Days per week: Not on file    Minutes per session: Not on file  . Stress: Not on file  Relationships  . Social connections:    Talks on phone: Not on file    Gets together: Not on file    Attends religious service: Not on file    Active member of club or  organization: Not on file    Attends meetings of clubs or organizations: Not on file    Relationship status: Not on file  . Intimate partner violence:    Fear of current or ex partner: Not on file    Emotionally abused: Not on file    Physically abused: Not on file    Forced sexual activity: Not on file  Other Topics Concern  . Not on file  Social History Narrative  . Not on file     PHYSICAL EXAM   Vitals:   11/05/17 1012  BP: 125/80  Pulse: 75  Weight: 186 lb (84.4 kg)  Height: 5\' 2"  (1.575 m)    Not recorded      Body mass index is 34.02 kg/m.  PHYSICAL EXAMNIATION:  Gen: NAD, conversant, well nourised, obese, well groomed                     Cardiovascular: Regular rate rhythm, no peripheral edema, warm, nontender. Eyes: Conjunctivae clear without exudates or hemorrhage Neck: Supple, no carotid bruits. Pulmonary: Clear to auscultation bilaterally   NEUROLOGICAL EXAM:  MENTAL STATUS: Speech:    Speech is normal; fluent and spontaneous with normal comprehension.  Cognition:     Orientation to time, place and person     Normal recent and remote memory     Normal Attention span and concentration     Normal Language, naming, repeating,spontaneous speech     Fund of knowledge   CRANIAL NERVES: CN II: Visual fields are full to confrontation. Fundoscopic exam is normal with sharp discs and no vascular changes. Pupils are round equal and briskly reactive to light. CN III, IV, VI: extraocular movement are normal. No ptosis. CN V: Facial sensation is intact to pinprick in all 3 divisions bilaterally. Corneal responses are intact.  CN VII: Face is symmetric with normal eye closure and smile. CN VIII: Hearing is normal to rubbing fingers CN IX, X: Palate elevates symmetrically. Phonation is normal. CN XI: Decreased hearing on the right side CN XII: Tongue is midline with normal movements and no atrophy.  MOTOR: There is no pronator drift of out-stretched arms.  Muscle bulk and tone are normal. Muscle strength is normal.  REFLEXES: Reflexes are 2+ and symmetric at the biceps, triceps, knees, and ankles. Plantar responses are flexor.  SENSORY: Intact to light touch, pinprick, positional sensation and vibratory sensation are intact in fingers and toes.  COORDINATION: Rapid alternating movements and fine finger movements are intact. There is no dysmetria on finger-to-nose and heel-knee-shin.    GAIT/STANCE: Posture is normal. Gait is steady with normal steps, base, arm swing, and turning. Heel and toe walking are normal. Tandem  gait is normal.  Romberg is absent.   DIAGNOSTIC DATA (LABS, IMAGING, TESTING) - I reviewed patient records, labs, notes, testing and imaging myself where available.   ASSESSMENT AND PLAN  Candace Hall is a 57 y.o. female   Vertigo  With right-sided tinnitus, hearing loss,  Possible Mnire's disease, need to rule out right CP angle pathology  MRI of brain with without contrast through internal acoustic canal  Meclizine as needed  Refer her to ENT  Marcial Pacas, M.D. Ph.D.  [facility] [address], [place], [place] [address] Ph: [phone] Fax: [phone]  CC:  Clent Demark, PA-C

## 2017-11-06 ENCOUNTER — Emergency Department (HOSPITAL_COMMUNITY): Payer: 59

## 2017-11-06 ENCOUNTER — Other Ambulatory Visit: Payer: Self-pay

## 2017-11-06 ENCOUNTER — Telehealth: Payer: Self-pay | Admitting: Neurology

## 2017-11-06 ENCOUNTER — Observation Stay (HOSPITAL_COMMUNITY)
Admission: EM | Admit: 2017-11-06 | Discharge: 2017-11-06 | Disposition: A | Discharge status: Home / Self Care | Payer: 59 | Attending: Internal Medicine | Admitting: Internal Medicine

## 2017-11-06 ENCOUNTER — Encounter (HOSPITAL_COMMUNITY): Payer: Self-pay | Admitting: Nurse Practitioner

## 2017-11-06 DIAGNOSIS — Z9889 Other specified postprocedural states: Secondary | ICD-10-CM | POA: Insufficient documentation

## 2017-11-06 DIAGNOSIS — Z8249 Family history of ischemic heart disease and other diseases of the circulatory system: Secondary | ICD-10-CM | POA: Diagnosis not present

## 2017-11-06 DIAGNOSIS — Z905 Acquired absence of kidney: Secondary | ICD-10-CM | POA: Insufficient documentation

## 2017-11-06 DIAGNOSIS — H269 Unspecified cataract: Secondary | ICD-10-CM | POA: Insufficient documentation

## 2017-11-06 DIAGNOSIS — Z87891 Personal history of nicotine dependence: Secondary | ICD-10-CM | POA: Diagnosis not present

## 2017-11-06 DIAGNOSIS — F419 Anxiety disorder, unspecified: Secondary | ICD-10-CM | POA: Diagnosis not present

## 2017-11-06 DIAGNOSIS — Z947 Corneal transplant status: Secondary | ICD-10-CM | POA: Insufficient documentation

## 2017-11-06 DIAGNOSIS — I471 Supraventricular tachycardia, unspecified: Secondary | ICD-10-CM | POA: Diagnosis present

## 2017-11-06 DIAGNOSIS — Z823 Family history of stroke: Secondary | ICD-10-CM | POA: Insufficient documentation

## 2017-11-06 DIAGNOSIS — R0789 Other chest pain: Secondary | ICD-10-CM | POA: Diagnosis present

## 2017-11-06 DIAGNOSIS — Z9851 Tubal ligation status: Secondary | ICD-10-CM | POA: Insufficient documentation

## 2017-11-06 DIAGNOSIS — Z91040 Latex allergy status: Secondary | ICD-10-CM | POA: Insufficient documentation

## 2017-11-06 DIAGNOSIS — Z880 Allergy status to penicillin: Secondary | ICD-10-CM | POA: Diagnosis not present

## 2017-11-06 LAB — TSH: TSH: 3.626 u[IU]/mL (ref 0.350–4.500)

## 2017-11-06 LAB — BASIC METABOLIC PANEL
Anion gap: 7 (ref 5–15)
BUN: 15 mg/dL (ref 6–20)
CALCIUM: 9.6 mg/dL (ref 8.9–10.3)
CHLORIDE: 108 mmol/L (ref 98–111)
CO2: 25 mmol/L (ref 22–32)
CREATININE: 1.01 mg/dL — AB (ref 0.44–1.00)
Glucose, Bld: 134 mg/dL — ABNORMAL HIGH (ref 70–99)
Potassium: 3.9 mmol/L (ref 3.5–5.1)
SODIUM: 140 mmol/L (ref 135–145)

## 2017-11-06 LAB — CBC
HCT: 38.3 % (ref 36.0–46.0)
Hemoglobin: 12.9 g/dL (ref 12.0–15.0)
MCH: 29.7 pg (ref 26.0–34.0)
MCHC: 33.7 g/dL (ref 30.0–36.0)
MCV: 88 fL (ref 78.0–100.0)
PLATELETS: 269 10*3/uL (ref 150–400)
RBC: 4.35 MIL/uL (ref 3.87–5.11)
RDW: 12.2 % (ref 11.5–15.5)
WBC: 9.2 10*3/uL (ref 4.0–10.5)

## 2017-11-06 LAB — I-STAT TROPONIN, ED: TROPONIN I, POC: 0 ng/mL (ref 0.00–0.08)

## 2017-11-06 LAB — HIV ANTIBODY (ROUTINE TESTING W REFLEX): HIV Screen 4th Generation wRfx: NONREACTIVE

## 2017-11-06 LAB — MAGNESIUM: Magnesium: 2.2 mg/dL (ref 1.7–2.4)

## 2017-11-06 LAB — D-DIMER, QUANTITATIVE (NOT AT ARMC)

## 2017-11-06 MED ORDER — METOPROLOL TARTRATE 25 MG PO TABS: 25.0000 mg | ORAL_TABLET | Freq: Two times a day (BID) | ORAL | Status: DC

## 2017-11-06 MED ORDER — METOPROLOL SUCCINATE ER 25 MG PO TB24: 25.0000 mg | ORAL_TABLET | Freq: Every day | ORAL | 0 refills | Status: DC

## 2017-11-06 MED ORDER — METOPROLOL SUCCINATE ER 50 MG PO TB24: 50.0000 mg | ORAL_TABLET | Freq: Every day | ORAL | Status: DC

## 2017-11-06 MED ORDER — METOPROLOL SUCCINATE ER 25 MG PO TB24
25.0000 mg | ORAL_TABLET | Freq: Every day | ORAL | Status: DC
  Administered 2017-11-06: 25 mg via ORAL
  Filled 2017-11-06: qty 1

## 2017-11-06 MED ORDER — METOPROLOL SUCCINATE ER 50 MG PO TB24: 50.0000 mg | ORAL_TABLET | Freq: Every day | ORAL | 3 refills | Status: DC

## 2017-11-06 MED ORDER — MECLIZINE HCL 25 MG PO TABS: 25.0000 mg | ORAL_TABLET | Freq: Three times a day (TID) | ORAL | Status: DC | PRN

## 2017-11-06 MED ORDER — DIAZEPAM 2 MG PO TABS: 2.0000 mg | ORAL_TABLET | Freq: Four times a day (QID) | ORAL | Status: DC | PRN

## 2017-11-06 MED ORDER — METOPROLOL TARTRATE 25 MG PO TABS
25.0000 mg | ORAL_TABLET | Freq: Once | ORAL | Status: DC
  Filled 2017-11-06: qty 1

## 2017-11-06 NOTE — H&P (Signed)
History and Physical  Primary Cardiologist:Jordan PCP: Clent Demark, PA-C  Chief Complaint: chest pressure, SVT  HPI:  Candace Hall is a 57 year old female with history of palpitations presenting to the [facility] emergency room with recurrent severe palpitations and chest discomfort.  She has had a challenging month of June with numerous emergency room visits for severe palpitations and chest discomfort.  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  She is also suffered recently from vertigo, and was seen recently by neurology with plans for a future brain MRI to better assess this.  Today she has had recurrent numerous salvos of SVT which she is very symptomatic with.  She was seen by Dr. Martinique 2 weeks ago for this and a Holter monitor was placed.  Prior to her presentation in the emergency room cardiology was called with results from the Holter monitor showing SVT rates to the 200s for roughly 15 seconds.  By this point, she was already in route to the hospital.  In the clinic setting, she was evaluated with an exercise treadmill test which was negative for ischemia.  Echo was performed 5 days prior which was normal.  In the emergency room currently, telemetry reveals frequent runs of SVT.  She denies caffeine use.  No other medications, no family history of this.  Husband is at bedside--both are frustrated at her current condition.  No active symptoms at the moment when in normal sinus. They do not feel comfortable going home given how frequent her spells have been today.  Prior Cardiac Studies:  Past Medical History:  Diagnosis Date  . Allergy   . Anxiety   . Cataract     Past Surgical History:  Procedure Laterality Date  . BREAST CYST EXCISION    . BREAST EXCISIONAL BIOPSY Right    benign  . CESAREAN SECTION    . CORNEAL TRANSPLANT    . KIDNEY SURGERY     a. s/p left nephrectomy  . TUBAL LIGATION      Family History  Problem Relation Age of Onset   . Hypertension Mother   . Stroke Mother 38  . Hypertension Brother   . Ulcers Father        "Bleeding"  . Other Brother 24       Cardiac Arrest   Social History:  reports that she quit smoking about 30 years ago. She has never used smokeless tobacco. She reports that she does not drink alcohol or use drugs.  Allergies:  Allergies  Allergen Reactions  . Latex Anaphylaxis  . Penicillins Anaphylaxis    Has patient had a PCN reaction causing immediate rash, facial/tongue/throat swelling, SOB or lightheadedness with hypotension: yes Has patient had a PCN reaction causing severe rash involving mucus membranes or skin necrosis: no Has patient had a PCN reaction that required hospitalization: yes Has patient had a PCN reaction occurring within the last 10 years: no If all of the above answers are "NO", then may proceed with Cephalosporin use.     No current facility-administered medications on file prior to encounter.    Current Outpatient Medications on File Prior to Encounter  Medication Sig Dispense Refill  . diazepam (VALIUM) 2 MG tablet Take 1 tablet (2 mg total) by mouth every 6 (six) hours as needed for anxiety. 30 tablet 0  . meclizine (ANTIVERT) 25 MG tablet Take 1 tablet (25 mg total) by mouth 3 (three) times daily as needed for dizziness. 30 tablet 0    @  medshecduled@ @medinfusions @  Results for orders placed or performed during the hospital encounter of 11/06/17 (from the past 48 hour(s))  Basic metabolic panel     Status: Abnormal   Collection Time: 11/06/17  1:15 AM  Result Value Ref Range   Sodium 140 135 - 145 mmol/L   Potassium 3.9 3.5 - 5.1 mmol/L   Chloride 108 98 - 111 mmol/L    Comment: Please note change in reference range.   CO2 25 22 - 32 mmol/L   Glucose, Bld 134 (H) 70 - 99 mg/dL    Comment: Please note change in reference range.   BUN 15 6 - 20 mg/dL    Comment: Please note change in reference range.   Creatinine, Ser 1.01 (H) 0.44 - 1.00 mg/dL    Calcium 9.6 8.9 - 10.3 mg/dL   GFR calc non Af Amer >60 >60 mL/min   GFR calc Af Amer >60 >60 mL/min    Comment: (NOTE) The eGFR has been calculated using the CKD EPI equation. This calculation has not been validated in all clinical situations. eGFR's persistently <60 mL/min signify possible Chronic Kidney Disease.    Anion gap 7 5 - 15    Comment: Performed at [facility] [address] [place], [place] [address]  CBC     Status: None   Collection Time: 11/06/17  1:15 AM  Result Value Ref Range   WBC 9.2 4.0 - 10.5 K/uL   RBC 4.35 3.87 - 5.11 MIL/uL   Hemoglobin 12.9 12.0 - 15.0 g/dL   HCT 38.3 36.0 - 46.0 %   MCV 88.0 78.0 - 100.0 fL   MCH 29.7 26.0 - 34.0 pg   MCHC 33.7 30.0 - 36.0 g/dL   RDW 12.2 11.5 - 15.5 %   Platelets 269 150 - 400 K/uL    Comment: Performed at [facility], [place] [address] [place], [place] [address]  I-stat troponin, ED     Status: None   Collection Time: 11/06/17  1:21 AM  Result Value Ref Range   Troponin i, poc 0.00 0.00 - 0.08 ng/mL   Comment 3            Comment: Due to the release kinetics of cTnI, a negative result within the first hours of the onset of symptoms does not rule out myocardial infarction with certainty. If myocardial infarction is still suspected, repeat the test at appropriate intervals.   D-dimer, quantitative (not at [facility])     Status: None   Collection Time: 11/06/17  2:38 AM  Result Value Ref Range   D-Dimer, Quant <0.27 0.00 - 0.50 ug/mL-FEU    Comment: (NOTE) At the manufacturer cut-off of 0.50 ug/mL FEU, this assay has been documented to exclude PE with a sensitivity and negative predictive value of 97 to 99%.  At this time, this assay has not been approved by the FDA to exclude DVT/VTE. Results should be correlated with clinical presentation. Performed at [facility], [place] [address], [place], [place] [address]   Magnesium     Status: None   Collection Time: 11/06/17  2:38 AM  Result Value  Ref Range   Magnesium 2.2 1.7 - 2.4 mg/dL    Comment: Performed at [facility] [address] [place], [place] [address]  TSH     Status: None   Collection Time: 11/06/17  2:40 AM  Result Value Ref Range   TSH 3.626 0.350 - 4.500 uIU/mL  Comment: Performed by a 3rd Generation assay with a functional sensitivity of <=0.01 uIU/mL. Performed at [facility], [place] [address], [place], [place] [address]    Dg Chest 2 View  Result Date: 11/06/2017 CLINICAL DATA:  Mid chest pain with palpitations. EXAM: CHEST - 2 VIEW COMPARISON:  10/29/2017 FINDINGS: The heart size and mediastinal contours are within normal limits. Both lungs are clear. The visualized skeletal structures are unremarkable. IMPRESSION: No active cardiopulmonary disease. Electronically Signed   By: Ashley Royalty M.D.   On: 11/06/2017 01:41    ECG/Tele: Telemetry shows frequent salvos of likely AVNRT narrow complex regular tachycardia rates up to 200.  Once in sinus, EKG is normal sinus rhythm and relatively normal  ROS: As above. Otherwise, review of systems is negative unless per above HPI  Vitals:   11/06/17 0215 11/06/17 0230 11/06/17 0245 11/06/17 0330  BP: 134/80 137/86 (!) 127/93 136/84  Pulse: 82 78 79 78  Resp: 10 13  13   Temp:      TempSrc:      SpO2: 91% 94% 93% 93%  Weight:      Height:       Wt Readings from Last 10 Encounters:  11/06/17 84.4 kg (186 lb)  11/05/17 84.4 kg (186 lb)  10/29/17 83.9 kg (185 lb)  10/23/17 84.4 kg (186 lb)  10/22/17 83.9 kg (185 lb)  08/22/17 85.1 kg (187 lb 9.6 oz)  12/31/16 86.2 kg (190 lb)  12/27/16 87.1 kg (192 lb)  11/08/16 87.9 kg (193 lb 12.8 oz)  11/06/16 72.6 kg (160 lb)    PE:  General: No acute distress HEENT: Atraumatic, EOMI, mucous membranes moist. No JVD at 45 degrees. No HJR. CV: RRR no murmurs, gallops.  Respiratory: Clear, no crackles. Normal work of breathing ABD: Non-distended and non-tender. No palpable organomegaly.  Extremities: 2+  radial pulses bilaterally. 0 edema. Neuro/Psych: CN grossly intact, alert and oriented  Assessment/Plan SVT, likely AVNRT  Patient has been having very frequent episodes of SVT today from which she is very symptomatic and debilitated.  While in the hospital she is also had numerous as well.  We discussed several options including conservative management, medical management, and catheter ablation should these others fail.  She reluctantly agreed to trialing metoprolol this evening.  However, she is suffering from dizziness spells for which she is seeing neurology and is very worried about any side effects from medications.  Abortive maneuvers also are not potentially good options for her given that the duration of her spells is short but the number of spells as frequent, at least today.  She and her husband would like to spend the night in the hospital under observation and hope that her frequency of spells decreases with metoprolol.  If possible they would also like to speak with EP tomorrow about catheter ablation.  Candace Cram Makailey Hodgkin  MD 11/06/2017, 4:10 AM

## 2017-11-06 NOTE — Discharge Summary (Signed)
Discharge Summary    Patient ID: Candace Hall,  MRN: [id number], DOB/AGE: [birth year] 57 y.o.  Admit date: 11/06/2017 Discharge date: 11/06/2017  Primary Care Provider: Clent Demark Primary Cardiologist: Dr. Martinique Primary Electrophysiologist:  Dr. Lovena Le  Discharge Diagnoses    Active Problems:   SVT (supraventricular tachycardia) (HCC)   HTN  Allergies Allergies  Allergen Reactions  . Latex Anaphylaxis  . Penicillins Anaphylaxis    Has patient had a PCN reaction causing immediate rash, facial/tongue/throat swelling, SOB or lightheadedness with hypotension: yes Has patient had a PCN reaction causing severe rash involving mucus membranes or skin necrosis: no Has patient had a PCN reaction that required hospitalization: yes Has patient had a PCN reaction occurring within the last 10 years: no If all of the above answers are "NO", then may proceed with Cephalosporin use.     Diagnostic Studies/Procedures    None   History of Present Illness     Candace Hall is a 57 year old female with history of palpitations presenting to the [facility] emergency room 6/26 with recurrent severe palpitations and chest discomfort.  She has had a challenging month of June with numerous emergency room visits for severe palpitations and chest discomfort. 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  She is also suffered recently from vertigo, and was seen recently by neurology with plans for a future brain MRI to better assess this.  She was seen by Dr. Martinique 2 weeks ago for this and a Holter monitor was placed.  Prior to her presentation in the emergency room cardiology was called with results from the Holter monitor showing SVT rates to the 200s for roughly 15 seconds.  By that point, she was already in route to the hospital.  In the clinic setting, she was evaluated with an exercise treadmill test which was negative for ischemia.  Echo was performed 5 days prior  which was normal.  In the emergency room, telemetry revealed frequent runs of SVT.  She denied caffeine use.  No other medications, no family history of this.   No active symptoms when in normal sinus. They did not felt comfortable going home given frequent spells.   The patient was admitted for repetitive runs of SVT rates up to 200-210. Not sustained but frequent.    Hospital Course     Consultants: EP  She was admitted and started on lopressor 25mg  BID. The patient was seen by Dr. Lovena Le in morning.  She has had no documented prolonged pauses with her SVT. The patient admits to a history of caffeine excess>>encouraged to avoid caffeine.   The mechanism is either AV node reentry or more likely atrial tachycardia. Recommended Toprol XL 25mg  x 1 week then up titrate to 50mg  qd. Plan to follow up with Dr. Lovena Le  in 4 to 6 weeks.  Continue to wear 30-day monitor to better assess her response to medical therapy with her beta-blocker.  She has been seen by Dr. Lovena Le today and deemed ready for discharge home. All follow-up appointments have been scheduled. Discharge medications are listed below.   Discharge Vitals Blood pressure 121/87, pulse 64, temperature 98.3 F (36.8 C), temperature source Oral, resp. rate 18, height 5\' 2"  (1.575 m), weight 185 lb (83.9 kg), last menstrual period 01/31/2014, SpO2 98 %.  Filed Weights   11/06/17 0109 11/06/17 0419  Weight: 186 lb (84.4 kg) 185 lb (83.9 kg)    Labs & Radiologic Studies  CBC Recent Labs    11/06/17 0115  WBC 9.2  HGB 12.9  HCT 38.3  MCV 88.0  PLT 756   Basic Metabolic Panel Recent Labs    11/06/17 0115 11/06/17 0238  NA 140  --   K 3.9  --   CL 108  --   CO2 25  --   GLUCOSE 134*  --   BUN 15  --   CREATININE 1.01*  --   CALCIUM 9.6  --   MG  --  2.2    Recent Labs    11/06/17 0238  DDIMER <0.27   Thyroid Function Tests Recent Labs    11/06/17 0240  TSH 3.626   Dg Chest 2 View  Result Date:  11/06/2017 CLINICAL DATA:  Mid chest pain with palpitations. EXAM: CHEST - 2 VIEW COMPARISON:  10/29/2017 FINDINGS: The heart size and mediastinal contours are within normal limits. Both lungs are clear. The visualized skeletal structures are unremarkable. IMPRESSION: No active cardiopulmonary disease. Electronically Signed   By: Ashley Royalty M.D.   On: 11/06/2017 01:41   Dg Chest 2 View  Result Date: 10/29/2017 CLINICAL DATA:  57 year old female with shortness of breath, dizziness and lightheadedness. Progressive symptoms today. Former smoker. EXAM: CHEST - 2 VIEW COMPARISON:  10/22/2017 and earlier. FINDINGS: Lung volumes and mediastinal contours are stable and within normal limits. Visualized tracheal air column is within normal limits. No pneumothorax, pulmonary edema, pleural effusion or confluent pulmonary opacity. Stable left abdominal surgical clips. Negative visible bowel gas pattern. No acute osseous abnormality identified. IMPRESSION: Negative.  No acute cardiopulmonary abnormality. Electronically Signed   By: Genevie Ann M.D.   On: 10/29/2017 18:02   Dg Chest 2 View  Result Date: 10/22/2017 CLINICAL DATA:  Palpitations.  Shortness of breath. EXAM: CHEST - 2 VIEW COMPARISON:  11/06/2016 FINDINGS: The cardiomediastinal contours are normal. The lungs are clear. Pulmonary vasculature is normal. No consolidation, pleural effusion, or pneumothorax. No acute osseous abnormalities are seen. IMPRESSION: No acute pulmonary process. Electronically Signed   By: Jeb Levering M.D.   On: 10/22/2017 01:32   Ct Head Wo Contrast  Result Date: 10/29/2017 CLINICAL DATA:  Dizziness 2 months with right base tingling today. EXAM: CT HEAD WITHOUT CONTRAST TECHNIQUE: Contiguous axial images were obtained from the base of the skull through the vertex without intravenous contrast. COMPARISON:  12/04/2015 FINDINGS: Brain: No evidence of acute infarction, hemorrhage, hydrocephalus, extra-axial collection or mass  lesion/mass effect. Vascular: No hyperdense vessel or unexpected calcification. Skull: Normal. Negative for fracture or focal lesion. Sinuses/Orbits: Orbits are normal. There is mild opacification over the ethmoid air cells. Mastoid air cells are clear. Other: None. IMPRESSION: No acute findings. Minimal chronic sinus inflammatory change. Electronically Signed   By: Marin Olp M.D.   On: 10/29/2017 18:09   Disposition   Pt is being discharged home today in good condition.  Follow-up Plans & Appointments    Follow-up Information    Evans Lance, MD Follow up.   Specialty:  Cardiology Why:  You will be called by Dr.Taylor's scheduler to arrange 5-6 week follow up visit Please call us if you did not heard by early next week Contact information: [address] [address] [phone]          Discharge Instructions    Diet - low sodium heart healthy   Complete by:  As directed    Increase activity slowly   Complete by:  As directed  Discharge Medications   Allergies as of 11/06/2017      Reactions   Latex Anaphylaxis   Penicillins Anaphylaxis   Has patient had a PCN reaction causing immediate rash, facial/tongue/throat swelling, SOB or lightheadedness with hypotension: yes Has patient had a PCN reaction causing severe rash involving mucus membranes or skin necrosis: no Has patient had a PCN reaction that required hospitalization: yes Has patient had a PCN reaction occurring within the last 10 years: no If all of the above answers are "NO", then may proceed with Cephalosporin use.      Medication List    TAKE these medications   diazepam 2 MG tablet Commonly known as:  VALIUM Take 1 tablet (2 mg total) by mouth every 6 (six) hours as needed for anxiety.   meclizine 25 MG tablet Commonly known as:  ANTIVERT Take 1 tablet (25 mg total) by mouth 3 (three) times daily as needed for dizziness.   metoprolol succinate 25 MG 24 hr  tablet Commonly known as:  TOPROL-XL Take 1 tablet (25 mg total) by mouth daily. Start taking on:  11/07/2017   metoprolol succinate 50 MG 24 hr tablet Commonly known as:  TOPROL-XL Take 1 tablet (50 mg total) by mouth daily. Take with or immediately following a meal. Start taking on:  11/14/2017        Acute coronary syndrome (MI, NSTEMI, STEMI, etc) this admission?: No.    Outstanding Labs/Studies   None  Duration of Discharge Encounter   Greater than 30 minutes including physician time.  Signed, [place], [place] 11/06/2017, 3:20 PM

## 2017-11-06 NOTE — ED Triage Notes (Signed)
Pt to ED via EMS for CP with sob and dizziness and heart palpitations. Hx of same, pt wearing heart monitor from cardiologist. CP subsided by EMS arrival. Pt in NSR with epsiodes of SVT 201 HR x2 in route. 324 ASA given PTA. NAD.

## 2017-11-06 NOTE — Progress Notes (Signed)
Patient arrived with holter monitor around neck.

## 2017-11-06 NOTE — Telephone Encounter (Signed)
[employer] Auth: [employer] via [employer] website order sent to GI. They will reach out to the pt to schedule.

## 2017-11-06 NOTE — Progress Notes (Signed)
Patient given discharge instructions and all questions answered.  

## 2017-11-06 NOTE — Progress Notes (Signed)
Dr. Martinique made aware that patient has been refusing to take metoprolol saying she is already tired and this medication could make that worse.

## 2017-11-06 NOTE — Plan of Care (Signed)
  Problem: Education: Goal: Knowledge of General Education information will improve Outcome: Progressing   Problem: Health Behavior/Discharge Planning: Goal: Ability to manage health-related needs will improve Outcome: Progressing   Problem: Activity: Goal: Risk for activity intolerance will decrease Outcome: Progressing   Problem: Coping: Goal: Level of anxiety will decrease Outcome: Progressing   Problem: Safety: Goal: Ability to remain free from injury will improve Outcome: Progressing   

## 2017-11-06 NOTE — Consult Note (Addendum)
Cardiology Consultation:   Candace Hall ID: Candace Candace Hall Candace Hall; [id number]; Jul 14, 1960   Admit date: 11/06/2017 Date of Consult: 11/06/2017  Primary Care Provider: Clent Demark, PA-C Primary Cardiologist: Dr. Martinique Primary Electrophysiologist:  New    Candace Hall Profile:   Candace Candace Hall Candace Hall is a 57 y.o. female with a hx of HTN only who is being seen today for Candace Candace Hall evaluation of SVT at Candace Candace Hall request of Dr. Martinique.  History of Present Illness:   Candace Candace Hall Candace Hall was admitted early this morning with palpitations.  These make her feel terrible with an element of CP and if longer then a couple seconds lightheaded, and very anxiety provoking.  This AM she felt like she may faint but did not.  She saw Dr. Martinique very recetly there was mention of palpitations and some degree of SOB, dizziness, she was ordered for an echo and EM.  Her echo was done on 11/01/17 with  There was mild focal basal hypertrophy of Candace Candace Hall septum. Systolic function was normal.  Candace Candace Hall estimated ejection fraction was in Candace Candace Hall range of 55% to 60%.  Wall motion was normal; there were no regional wall motion abnormalities, she had grade II DD, monitor apparently noted brief SVTs, I can not locate tracings.   LABS K+ 3.9 BUN/Creat 15/1.01 Mag 2.2 poc Trop 0.00 WBC 9.2 H/H 12/38 Plts 269 Ddimer <0.27 TSH 3.626  Candace Candace Hall Candace Hall reports Candace Candace Hall onset of Candace Candace Hall palpitations about 3 mo ago.  She had been drinking a [employer] energy drink though stopped it and now ding a regular slim fast for break fast lunch (snacks in between) and healthy dinner.  Candace Candace Hall change did not seem to alleviate or affect Candace Candace Hall palpitations at all.  She denies any tobacco, ETOH or drugs, denies any significant caffeine intake, and report very good water intake.  She does not exercise but her job has her on her feet and walking much of Candace Candace Hall day, not very active otherwise.  She also mentions that in Candace Candace Hall shower on a few occassions she has felt dizzy without Candace Candace Hall palpitations.  No syncope.   She reports her BP managed by sodium restriction, and she is actively taking efforts to lose weight, she denies any medicines prescribed or OTC.  She has not found any clear correlation or trigger with anything for her palpitations but states they seem to be worse at night.  Past Medical History:  Diagnosis Date  . Allergy   . Anxiety   . Cataract     Past Surgical History:  Procedure Laterality Date  . BREAST CYST EXCISION    . BREAST EXCISIONAL BIOPSY Right    benign  . CESAREAN SECTION    . CORNEAL TRANSPLANT    . KIDNEY SURGERY     a. s/p left nephrectomy  . TUBAL LIGATION         Inpatient Medications: Scheduled Meds: . metoprolol tartrate  25 mg Oral Once  . metoprolol tartrate  25 mg Oral BID   Continuous Infusions:  PRN Meds: diazepam, meclizine  Allergies:    Allergies  Allergen Reactions  . Latex Anaphylaxis  . Penicillins Anaphylaxis    Has Candace Hall had a PCN reaction causing immediate rash, facial/tongue/throat swelling, SOB or lightheadedness with hypotension: yes Has Candace Hall had a PCN reaction causing severe rash involving mucus membranes or skin necrosis: no Has Candace Hall had a PCN reaction that required hospitalization: yes Has Candace Hall had a PCN reaction occurring within Candace Candace Hall last 10 years: no If all of Candace Candace Hall above answers are "NO",  then may proceed with Cephalosporin use.     Social History:   Social History   Socioeconomic History  . Marital status: Married    Spouse name: Not on file  . Number of children: Not on file  . Years of education: Not on file  . Highest education level: Not on file  Occupational History  . Not on file  Social Needs  . Financial resource strain: Not on file  . Food insecurity:    Worry: Not on file    Inability: Not on file  . Transportation needs:    Medical: Not on file    Non-medical: Not on file  Tobacco Use  . Smoking status: Former Smoker    Last attempt to quit: 05/15/1987    Years since quitting: 30.5  .  Smokeless tobacco: Never Used  Substance and Sexual Activity  . Alcohol use: No  . Drug use: No  . Sexual activity: Yes  Lifestyle  . Physical activity:    Days per week: Not on file    Minutes per session: Not on file  . Stress: Not on file  Relationships  . Social connections:    Talks on phone: Not on file    Gets together: Not on file    Attends religious service: Not on file    Active member of club or organization: Not on file    Attends meetings of clubs or organizations: Not on file    Relationship status: Not on file  . Intimate partner violence:    Fear of current or ex partner: Not on file    Emotionally abused: Not on file    Physically abused: Not on file    Forced sexual activity: Not on file  Other Topics Concern  . Not on file  Social History Narrative  . Not on file    Family History:   Family History  Problem Relation Age of Onset  . Hypertension Mother   . Stroke Mother 67  . Hypertension Brother   . Ulcers Father        "Bleeding"  . Other Brother 61       Cardiac Arrest     ROS:  Please see Candace Candace Hall history of present illness.  All other ROS reviewed and negative.     Physical Exam/Data:   Vitals:   11/06/17 0245 11/06/17 0330 11/06/17 0419 11/06/17 1123  BP: (!) 127/93 136/84 (!) 149/94 121/87  Pulse: 79 78 78 64  Resp:  13 18 18   Temp:   98.3 F (36.8 C) 98.3 F (36.8 C)  TempSrc:   Oral Oral  SpO2: 93% 93% 99% 98%  Weight:   185 lb (83.9 kg)   Height:        Intake/Output Summary (Last 24 hours) at 11/06/2017 1137 Last data filed at 11/06/2017 0800 Gross per 24 hour  Intake 480 ml  Output -  Net 480 ml   Filed Weights   11/06/17 0109 11/06/17 0419  Weight: 186 lb (84.4 kg) 185 lb (83.9 kg)   Body mass index is 33.84 kg/m.  General:  Well nourished, well developed, in no acute distress HEENT: normal Lymph: no adenopathy Neck: no JVD Endocrine:  No thryomegaly Vascular: No carotid bruits Cardiac:  RRR; no murmurs, gallops  or rubs Lungs:  CTA b/l,  no wheezing, rhonchi or rales  Abd: soft, nontender, obese Ext: no edema Musculoskeletal:  No deformities Skin: warm and dry  Neuro:  No gross focal abnormalities noted  Psych:  Normal affect   EKG:  Candace Candace Hall EKG was personally reviewed and demonstrates:   SR, 95bpm, borderline 1st degree AVBlock, PR 262ms, QRS 23ms, QTc 480ms Telemetry:  Telemetry was personally reviewed and demonstrates:   SR, ST 80's-120's, SVT towards 200's, lasting 2-15 seconds though are recurrent  Relevant CV Studies:  11/01/17: TTE Study Conclusions - Left ventricle: Candace Candace Hall cavity size was normal. There was mild focal   basal hypertrophy of Candace Candace Hall septum. Systolic function was normal.   Candace Candace Hall estimated ejection fraction was in Candace Candace Hall range of 55% to 60%.   Wall motion was normal; there were no regional wall motion   abnormalities. Features are consistent with a pseudonormal left   ventricular filling pattern, with concomitant abnormal relaxation   and increased filling pressure (grade 2 diastolic dysfunction). - Aortic valve: Transvalvular velocity was within Candace Candace Hall normal range.   There was no stenosis. There was no regurgitation. - Mitral valve: Transvalvular velocity was within Candace Candace Hall normal range.   There was no evidence for stenosis. - Right ventricle: Candace Candace Hall cavity size was normal. Wall thickness was   normal. Systolic function was normal. - Atrial septum: No defect or patent foramen ovale was identified   by color flow Doppler. - Tricuspid valve: There was trivial regurgitation. - Pulmonary arteries: Systolic pressure was within Candace Candace Hall normal   range. PA peak pressure: 17 mm Hg (S).  01/03/17: ETT  Blood pressure demonstrated a hypertensive response to exercise.  There was no ST segment deviation noted during stress.   1. Below average exercise tolerance.  2. No evidence for ischemia by ST segment analysis.  3. Hypertensive BP response.   Laboratory Data:  Chemistry Recent Labs  Lab  11/06/17 0115  NA 140  K 3.9  CL 108  CO2 25  GLUCOSE 134*  BUN 15  CREATININE 1.01*  CALCIUM 9.6  GFRNONAA >60  GFRAA >60  ANIONGAP 7    No results for input(s): PROT, ALBUMIN, AST, ALT, ALKPHOS, BILITOT in Candace Candace Hall last 168 hours. Hematology Recent Labs  Lab 11/06/17 0115  WBC 9.2  RBC 4.35  HGB 12.9  HCT 38.3  MCV 88.0  MCH 29.7  MCHC 33.7  RDW 12.2  PLT 269   Cardiac EnzymesNo results for input(s): TROPONINI in Candace Candace Hall last 168 hours.  Recent Labs  Lab 11/06/17 0121  TROPIPOC 0.00    BNPNo results for input(s): BNP, PROBNP in Candace Candace Hall last 168 hours.  DDimer  Recent Labs  Lab 11/06/17 0238  DDIMER <0.27    Radiology/Studies:   Dg Chest 2 View Result Date: 11/06/2017 CLINICAL DATA:  Mid chest pain with palpitations. EXAM: CHEST - 2 VIEW COMPARISON:  10/29/2017 FINDINGS: Candace Candace Hall heart size and mediastinal contours are within normal limits. Both lungs are clear. Candace Candace Hall visualized skeletal structures are unremarkable. IMPRESSION: No active cardiopulmonary disease. Electronically Signed   By: Ashley Royalty M.D.   On: 11/06/2017 01:41    Assessment and Plan:   1. SVT     Very symptomatic     Agree with initial management with BB, Candace Candace Hall Candace Hall is reluctant with concerns of potential side effects of fatigue particularly and in general states she just doesn't like/want to take medicines, though will be agreeable to try.  Dr. Lovena Le will see Candace Candace Hall Candace Hall this afternoon  2. HTN     DD on her echo     130's-140's, 80's-90's     BB would help this as well  ADDEND: Dr. Lovena Le has seen and examined Candace Hall, will change  lopressor to Toprol XL 25mg  daily for a week and recommend up-titration to 50mg  daily after that. I have staff messaged Dr. Tanna Furry scheduler to arrange 5-6 week follow up. OK to discharge from our perspective   For questions or updates, please contact [facility] Please consult [email or web address] for contact info under Cardiology/STEMI.   Signed, Baldwin [place],  PA-C  11/06/2017 11:37 AM   EP attending  Candace Hall seen and examined.  Agree with Candace Candace Hall findings as noted above.  Candace Candace Hall Candace Hall has had salvos of symptomatic SVT at rates of over 160 bpm.  These episodes last for seconds to less than half minute at a time and stop spontaneously.  She has had no documented prolonged pauses with her SVT.  Candace Candace Hall Candace Hall admits to a history of caffeine excess.  She has been on no medical therapy for her SVT.  Candace Candace Hall mechanism is either AV node reentry or more likely atrial tachycardia.  Her exam is unremarkable except that she is overweight.  ECG demonstrates sinus rhythm with no ventricular preexcitation.  Telemetry demonstrates salvos of SVT as noted above.  I would recommend Candace Candace Hall Candace Hall start low-dose beta-blocker therapy with gradual up titration as she is able to tolerate.  Candace Candace Hall beta-blocker will help treat both her hypertension and her SVT.  She is encouraged to avoid caffeine.  I will plan to see her back in 4 to 6 weeks.  She asked about Candace Candace Hall continuation of her 30-day monitor.  I would recommend that she continue her monitor so that we can better assess her response to medical therapy with her beta-blocker.  Crissie Sickles, MD

## 2017-11-06 NOTE — ED Provider Notes (Signed)
[facility] EMERGENCY DEPARTMENT Provider Note   CSN: [id number] Arrival date & time: 11/06/17  0101     History   Chief Complaint Chief Complaint  Patient presents with  . Chest Pain    HPI Candace Hall is a 57 y.o. female.  Patient presents with palpitations, chest pain or shortness of breath that woke her from sleep.  She states this is been happening intermittently for the past 3 weeks.  She saw cardiologist 2 weeks ago and is currently wearing a Holter monitor.  She states these episodes happen randomly but have become more severe tonight and persistent.  She said multiple episodes of racing heart with palpitations lasting for a few seconds at a time but are associated with chest pressure and shortness of breath and dizziness.  These episodes  last about 5 to 10 seconds and then resolved but continued to recur and become more persistent.  She does not know what brings the obvious palpitations.  Denies any caffeine intake.  Denies any recent medication changes.  She denies any chest pain or shortness of breath unless she is having the palpitations.  The history is provided by the patient and a relative.  Chest Pain   Associated symptoms include dizziness and shortness of breath. Pertinent negatives include no abdominal pain, no headaches, no nausea and no vomiting.    Past Medical History:  Diagnosis Date  . Allergy   . Anxiety   . Cataract     Patient Active Problem List   Diagnosis Date Noted  . Dizziness 12/31/2016  . Transient elevated blood pressure 12/31/2016  . Solitary kidney, acquired 12/02/2014  . Menopausal and perimenopausal disorder 05/24/2014  . Fibroid uterus 02/18/2014    Past Surgical History:  Procedure Laterality Date  . BREAST CYST EXCISION    . BREAST EXCISIONAL BIOPSY Right    benign  . CESAREAN SECTION    . CORNEAL TRANSPLANT    . KIDNEY SURGERY     a. s/p left nephrectomy  . TUBAL LIGATION       OB History    Gravida  4   Para  2   Term  2   Preterm      AB  2   Living  2     SAB      TAB  2   Ectopic      Multiple      Live Births               Home Medications    Prior to Admission medications   Medication Sig Start Date End Date Taking? Authorizing Provider  diazepam (VALIUM) 2 MG tablet Take 1 tablet (2 mg total) by mouth every 6 (six) hours as needed for anxiety. 11/05/17   Marcial Pacas, MD  meclizine (ANTIVERT) 25 MG tablet Take 1 tablet (25 mg total) by mouth 3 (three) times daily as needed for dizziness. 11/05/17   Marcial Pacas, MD    Family History Family History  Problem Relation Age of Onset  . Hypertension Mother   . Stroke Mother 42  . Hypertension Brother   . Ulcers Father        "Bleeding"  . Other Brother 31       Cardiac Arrest    Social History Social History   Tobacco Use  . Smoking status: Former Smoker    Last attempt to quit: 05/15/1987    Years since quitting: 30.5  . Smokeless tobacco: Never Used  Substance Use Topics  . Alcohol use: No  . Drug use: No     Allergies   Latex and Penicillins   Review of Systems Review of Systems  Constitutional: Negative for activity change and appetite change.  HENT: Negative for congestion and rhinorrhea.   Eyes: Negative for visual disturbance.  Respiratory: Positive for chest tightness and shortness of breath.   Cardiovascular: Positive for chest pain.  Gastrointestinal: Negative for abdominal pain, nausea and vomiting.  Genitourinary: Negative for dysuria and hematuria.  Musculoskeletal: Negative for arthralgias and myalgias.  Neurological: Positive for dizziness. Negative for tremors and headaches.   all other systems are negative except as noted in the HPI and PMH.     Physical Exam Updated Vital Signs BP (!) 134/94   Pulse 84   Temp 98.6 F (37 C) (Oral)   Resp 13   Ht 5\' 2"  (1.575 m)   Wt 84.4 kg (186 lb)   LMP 01/31/2014   SpO2 94%   BMI 34.02 kg/m   Physical Exam    Constitutional: She is oriented to person, place, and time. She appears well-developed and well-nourished. No distress.  HENT:  Head: Normocephalic and atraumatic.  Mouth/Throat: Oropharynx is clear and moist. No oropharyngeal exudate.  Eyes: Pupils are equal, round, and reactive to light. Conjunctivae and EOM are normal.  Neck: Normal range of motion. Neck supple.  No meningismus.  Cardiovascular: Normal rate, regular rhythm, normal heart sounds and intact distal pulses.  No murmur heard. Pulmonary/Chest: Effort normal and breath sounds normal. No respiratory distress. She exhibits no tenderness.  Abdominal: Soft. There is no tenderness. There is no rebound and no guarding.  Musculoskeletal: Normal range of motion. She exhibits no edema or tenderness.  Neurological: She is alert and oriented to person, place, and time. No cranial nerve deficit. She exhibits normal muscle tone. Coordination normal.   5/5 strength throughout. CN 2-12 intact.Equal grip strength.   Skin: Skin is warm.  Psychiatric: She has a normal mood and affect. Her behavior is normal.  Nursing note and vitals reviewed.    ED Treatments / Results  Labs (all labs ordered are listed, but only abnormal results are displayed) Labs Reviewed  BASIC METABOLIC PANEL - Abnormal; Notable for the following components:      Result Value   Glucose, Bld 134 (*)    Creatinine, Ser 1.01 (*)    All other components within normal limits  CBC  D-DIMER, QUANTITATIVE (NOT AT [facility])  TSH  MAGNESIUM  HIV ANTIBODY (ROUTINE TESTING)  I-STAT TROPONIN, ED    EKG None  Radiology Dg Chest 2 View  Result Date: 11/06/2017 CLINICAL DATA:  Mid chest pain with palpitations. EXAM: CHEST - 2 VIEW COMPARISON:  10/29/2017 FINDINGS: The heart size and mediastinal contours are within normal limits. Both lungs are clear. The visualized skeletal structures are unremarkable. IMPRESSION: No active cardiopulmonary disease. Electronically Signed   By:  Ashley Royalty M.D.   On: 11/06/2017 01:41    Procedures Procedures (including critical care time)  Medications Ordered in ED Medications - No data to display   Initial Impression / Assessment and Plan / ED Course  I have reviewed the triage vital signs and the nursing notes.  Pertinent labs & imaging results that were available during my care of the patient were reviewed by me and considered in my medical decision making (see chart for details).    Patient with recurrent episodes of palpitations, shortness of breath, dizziness and chest pressure.  Found to have rapid episodes of SVT with rates of 180-200s.  These episodes come and go lasting for 5 to 10 seconds at a time.  Patient asymptomatic in between episodes.  Labs are reassuring with normal troponin and d-dimer.  Patient given IV fluids.  TSH is normal. Discussed with Dr. Lamona Curl of cardiology who agrees with trial of low-dose beta-blocker  He will assess patient.  Patient and family are reluctant to go home.  He will be observed in the hospital tonight for beta-blocker introduction and possible discussions of ablation. Final Clinical Impressions(s) / ED Diagnoses   Final diagnoses:  None    ED Discharge Orders    None       Arlon Bleier, Annie Main, MD 11/06/17 [phone]

## 2017-11-06 NOTE — ED Notes (Signed)
Patient transported to X-ray 

## 2017-11-06 NOTE — Progress Notes (Signed)
Patient arrived to unit, ambulated to bed. No complaints of pain. Patient A&O x4, call bell in reach, updated on plan of care, safety precautions in place.

## 2017-11-06 NOTE — Progress Notes (Signed)
Patient seen and examined. History, labs and telemetry reviewed.  Patient with repetitive runs of SVT rates up to 200-210. Not sustained but frequent. Agree with Dr. Lamona Curl assessment. Will initiate beta blocker therapy. I have asked EP to see to give [place] further guidance. Anticipate possible DC today after EP assessment.   Candace Martinique MD, [facility]

## 2017-12-07 ENCOUNTER — Ambulatory Visit
Admission: RE | Admit: 2017-12-07 | Discharge: 2017-12-07 | Disposition: A | Discharge status: Home / Self Care | Payer: 59 | Source: Ambulatory Visit | Attending: Neurology | Admitting: Neurology

## 2017-12-07 DIAGNOSIS — H81399 Other peripheral vertigo, unspecified ear: Secondary | ICD-10-CM

## 2017-12-07 MED ORDER — GADOBENATE DIMEGLUMINE 529 MG/ML IV SOLN
17.0000 mL | Freq: Once | INTRAVENOUS | Status: AC | PRN
  Administered 2017-12-07: 17 mL via INTRAVENOUS

## 2017-12-09 ENCOUNTER — Telehealth: Payer: Self-pay | Admitting: Neurology

## 2017-12-09 NOTE — Telephone Encounter (Signed)
Spoke to patient - she is aware of results.  She denies being symptomatic.  She already has a pending appt with Dr. Ernesto Rutherford at ENT.

## 2017-12-09 NOTE — Telephone Encounter (Signed)
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  If she has sinusitis symptoms, may refer her to ENT for evaluations.   IMPRESSION: This MRI of the brain with and without contrast shows the following: 1.   There are some scattered T2/FLAIR hyperintense foci in the subcortical white matter of the hemispheres.  This is nonspecific and likely represents either early mild chronic microvascular ischemic changes or the sequela of migraine headaches.  These do not appear to be acute. 2.   The internal auditory canals appear normal    3.   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  If symptomatic, consider referral to ENT. 4.   There are no acute findings.

## 2017-12-11 ENCOUNTER — Encounter: Payer: Self-pay | Admitting: Internal Medicine

## 2017-12-11 ENCOUNTER — Ambulatory Visit (INDEPENDENT_AMBULATORY_CARE_PROVIDER_SITE_OTHER): Payer: 59 | Admitting: Internal Medicine

## 2017-12-11 VITALS — BP 124/80 | HR 58 | Ht 62.0 in | Wt 185.2 lb

## 2017-12-11 DIAGNOSIS — R002 Palpitations: Secondary | ICD-10-CM | POA: Diagnosis not present

## 2017-12-11 DIAGNOSIS — I471 Supraventricular tachycardia: Secondary | ICD-10-CM | POA: Diagnosis not present

## 2017-12-11 NOTE — Progress Notes (Signed)
HPI Candace Hall returns today for followup of NS SVT and HTN. She is a pleasant 57 yo woman with SVT and HTN who presented with SVT several weeks ago. She was placed on beta blocker therapy and encouraged to reduce her excessive caffeine intake. In the interim, she has done well with no chest pain or sob. No heart racing. She has reduced the dose of her fluid intake. Allergies  Allergen Reactions  . Latex Anaphylaxis  . Penicillins Anaphylaxis    Has patient had a PCN reaction causing immediate rash, facial/tongue/throat swelling, SOB or lightheadedness with hypotension: yes Has patient had a PCN reaction causing severe rash involving mucus membranes or skin necrosis: no Has patient had a PCN reaction that required hospitalization: yes Has patient had a PCN reaction occurring within the last 10 years: no If all of the above answers are "NO", then may proceed with Cephalosporin use.      Current Outpatient Medications  Medication Sig Dispense Refill  . metoprolol succinate (TOPROL-XL) 50 MG 24 hr tablet Take 1 tablet (50 mg total) by mouth daily. Take with or immediately following a meal. 30 tablet 3   No current facility-administered medications for this visit.      Past Medical History:  Diagnosis Date  . Allergy   . Anxiety   . Cataract     ROS:   All systems reviewed and negative except as noted in the HPI.   Past Surgical History:  Procedure Laterality Date  . BREAST CYST EXCISION    . BREAST EXCISIONAL BIOPSY Right    benign  . CESAREAN SECTION    . CORNEAL TRANSPLANT    . KIDNEY SURGERY     a. s/p left nephrectomy  . TUBAL LIGATION       Family History  Problem Relation Age of Onset  . Hypertension Mother   . Stroke Mother 54  . Hypertension Brother   . Ulcers Father        "Bleeding"  . Other Brother 91       Cardiac Arrest     Social History   Socioeconomic History  . Marital status: Married    Spouse name: Not on file  . Number of  children: Not on file  . Years of education: Not on file  . Highest education level: Not on file  Occupational History  . Not on file  Social Needs  . Financial resource strain: Not on file  . Food insecurity:    Worry: Not on file    Inability: Not on file  . Transportation needs:    Medical: Not on file    Non-medical: Not on file  Tobacco Use  . Smoking status: Former Smoker    Last attempt to quit: 05/15/1987    Years since quitting: 30.5  . Smokeless tobacco: Never Used  Substance and Sexual Activity  . Alcohol use: No  . Drug use: No  . Sexual activity: Yes  Lifestyle  . Physical activity:    Days per week: Not on file    Minutes per session: Not on file  . Stress: Not on file  Relationships  . Social connections:    Talks on phone: Not on file    Gets together: Not on file    Attends religious service: Not on file    Active member of club or organization: Not on file    Attends meetings of clubs or organizations: Not on file  Relationship status: Not on file  . Intimate partner violence:    Fear of current or ex partner: Not on file    Emotionally abused: Not on file    Physically abused: Not on file    Forced sexual activity: Not on file  Other Topics Concern  . Not on file  Social History Narrative  . Not on file     BP 124/80   Pulse (!) 58   Ht 5\' 2"  (1.575 m)   Wt 185 lb 3.2 oz (84 kg)   LMP 01/31/2014   SpO2 95%   BMI 33.87 kg/m   Physical Exam:  Well appearing 57 yo woman, NAD HEENT: Unremarkable Neck:  6 cm JVD, no thyromegally Lymphatics:  No adenopathy Back:  No CVA tenderness Lungs:  Clear with no wheezes HEART:  Regular rate rhythm, no murmurs, no rubs, no clicks Abd:  soft, positive bowel sounds, no organomegally, no rebound, no guarding Ext:  2 plus pulses, no edema, no cyanosis, no clubbing Skin:  No rashes no nodules Neuro:  CN II through XII intact, motor grossly intact  EKG - NSR  Assess/Plan: 1. SVT - the mechanism  seemed most likely to be AT. I recommended she continue her beta blocker and undergo watchful waiting. If she has prolonged breakthroughs then we would consider ablation. I asked her to reduce her caffeine intake. 2. HTN -her blood pressure is well controlled.  3. Obesity - she needs to lose weight.  4. Chest pain - this is much improved. No additional workup at this time.  Candace Hall.D.

## 2017-12-11 NOTE — Patient Instructions (Addendum)

## 2017-12-13 NOTE — Addendum Note (Signed)
Addended by: Rose Phi on: 12/13/2017 08:04 AM   Modules accepted: Orders

## 2018-01-07 ENCOUNTER — Ambulatory Visit: Payer: 59 | Admitting: Neurology

## 2018-02-06 ENCOUNTER — Encounter (HOSPITAL_COMMUNITY): Payer: Self-pay | Admitting: *Deleted

## 2018-02-06 ENCOUNTER — Other Ambulatory Visit: Payer: Self-pay

## 2018-02-06 ENCOUNTER — Emergency Department (HOSPITAL_COMMUNITY): Payer: 59

## 2018-02-06 DIAGNOSIS — Z5321 Procedure and treatment not carried out due to patient leaving prior to being seen by health care provider: Secondary | ICD-10-CM | POA: Diagnosis not present

## 2018-02-06 DIAGNOSIS — M542 Cervicalgia: Secondary | ICD-10-CM | POA: Insufficient documentation

## 2018-02-06 DIAGNOSIS — R0789 Other chest pain: Secondary | ICD-10-CM | POA: Insufficient documentation

## 2018-02-06 DIAGNOSIS — M546 Pain in thoracic spine: Secondary | ICD-10-CM | POA: Insufficient documentation

## 2018-02-06 NOTE — ED Triage Notes (Signed)
Pt says about 1 hour ago she was lying down and had onset of pressure and burning in the middle chest and mid back area. She says she has had neck pain since about 0930 yesterday. Taking BP medications as prescribed.

## 2018-02-07 ENCOUNTER — Emergency Department (HOSPITAL_COMMUNITY)
Admission: EM | Admit: 2018-02-07 | Discharge: 2018-02-07 | Disposition: A | Discharge status: Home / Self Care | Payer: 59 | Attending: Emergency Medicine | Admitting: Emergency Medicine

## 2018-02-07 ENCOUNTER — Telehealth: Payer: Self-pay | Admitting: Internal Medicine

## 2018-02-07 LAB — BASIC METABOLIC PANEL
Anion gap: 7 (ref 5–15)
BUN: 17 mg/dL (ref 6–20)
CO2: 24 mmol/L (ref 22–32)
Calcium: 9.5 mg/dL (ref 8.9–10.3)
Chloride: 109 mmol/L (ref 98–111)
Creatinine, Ser: 1.11 mg/dL — ABNORMAL HIGH (ref 0.44–1.00)
GFR calc Af Amer: >60 mL/min (ref >60)
GFR calc non Af Amer: 55 mL/min — ABNORMAL LOW (ref >60)
Glucose, Bld: 119 mg/dL — ABNORMAL HIGH (ref 70–99)
POTASSIUM: 4.1 mmol/L (ref 3.5–5.1)
SODIUM: 140 mmol/L (ref 135–145)

## 2018-02-07 LAB — CBC
HCT: 38.8 % (ref 36.0–46.0)
Hemoglobin: 13.3 g/dL (ref 12.0–15.0)
MCH: 30.6 pg (ref 26.0–34.0)
MCHC: 34.3 g/dL (ref 30.0–36.0)
MCV: 89.2 fL (ref 78.0–100.0)
Platelets: 300 10*3/uL (ref 150–400)
RBC: 4.35 MIL/uL (ref 3.87–5.11)
RDW: 12.7 % (ref 11.5–15.5)
WBC: 8.5 10*3/uL (ref 4.0–10.5)

## 2018-02-07 LAB — POCT I-STAT TROPONIN I: Troponin i, poc: 0 ng/mL (ref 0.00–0.08)

## 2018-02-07 NOTE — Telephone Encounter (Signed)
° ° °  Pt c/o medication issue:  1. Name of Medication: metoprolol succinate (TOPROL-XL) 50 MG 24 hr tablet  2. How are you currently taking this medication (dosage and times per day)? Take 1 tablet (50 mg total) by mouth daily. Take with or immediately following a meal.  3. Are you having a reaction (difficulty breathing--STAT)? Neck pain, SOB, burning sensation in chest and back on 9/26  4. What is your medication issue? Patient feels Metoprolol may be causing the symptoms. Patient went to ED on yesterday, left without seeing the doctor

## 2018-02-07 NOTE — Telephone Encounter (Signed)
Left message to call back and ask for triage

## 2018-02-10 ENCOUNTER — Telehealth: Payer: Self-pay | Admitting: Internal Medicine

## 2018-02-10 NOTE — Telephone Encounter (Signed)
New Message         STAT if patient feels like he/she is going to faint   1) Are you dizzy now? Yes  Do you feel faint or have you passed out? Yes 2) Do you have any other symptoms? Heart racing, feeling faint, blurred vision  3) Have you checked your HR and BP (record if available)? No
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
## 2018-02-10 NOTE — Telephone Encounter (Signed)
Pt called to report that she was in the ER 02/07/18 but did not stay for a plan since it was taking a considerable amount of time.. She is c/o experiencing dizziness and light headedness even at rest while watching TV. She denies SOB, chest pain, and palpitations but feels her heart racing. She does not know her BP and HR. She is very anxious and wants to be seen asap... I advised her that if things worsen that she needs to go back to the ER but if not I made her an appt with Jory Sims NP.Marland Kitchen On Feb 12, 2018.

## 2018-02-12 ENCOUNTER — Ambulatory Visit (INDEPENDENT_AMBULATORY_CARE_PROVIDER_SITE_OTHER): Payer: 59 | Admitting: Adult Health

## 2018-02-12 ENCOUNTER — Encounter: Payer: Self-pay | Admitting: Adult Health

## 2018-02-12 VITALS — BP 116/76 | HR 50 | Ht 62.0 in | Wt 183.5 lb

## 2018-02-12 DIAGNOSIS — I471 Supraventricular tachycardia: Secondary | ICD-10-CM | POA: Diagnosis not present

## 2018-02-12 DIAGNOSIS — R5383 Other fatigue: Secondary | ICD-10-CM | POA: Diagnosis not present

## 2018-02-12 MED ORDER — METOPROLOL SUCCINATE ER 50 MG PO TB24: 25.0000 mg | ORAL_TABLET | Freq: Every day | ORAL | 3 refills | Status: DC

## 2018-02-12 NOTE — Patient Instructions (Signed)
Medication Instructions:  DECREASE METOPROLOL 25MG  (1/2TAB) TAKE AT NIGHT  If you need a refill on your cardiac medications before your next appointment, please call your pharmacy.  Follow-Up: Your physician wants you to follow-up in: [place] DNP,AACC(NURSE PRACTITIONER) IF PRIMARY CARDIOLOGIST ([place]) IS UNAVAILABLE.    Thank you for choosing [facility] at [facility]!!

## 2018-03-18 ENCOUNTER — Ambulatory Visit: Payer: 59 | Admitting: Adult Health
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
## 2018-03-31 ENCOUNTER — Ambulatory Visit (INDEPENDENT_AMBULATORY_CARE_PROVIDER_SITE_OTHER): Payer: 59 | Admitting: Adult Health

## 2018-03-31 ENCOUNTER — Encounter: Payer: Self-pay | Admitting: Adult Health

## 2018-03-31 VITALS — BP 115/82 | HR 73 | Ht 62.0 in | Wt 181.6 lb

## 2018-03-31 DIAGNOSIS — I471 Supraventricular tachycardia: Secondary | ICD-10-CM | POA: Diagnosis not present

## 2018-03-31 MED ORDER — METOPROLOL SUCCINATE ER 25 MG PO TB24: 25.0000 mg | ORAL_TABLET | Freq: Every day | ORAL | 3 refills | Status: DC

## 2018-03-31 MED ORDER — METOPROLOL SUCCINATE ER 25 MG PO TB24: 25.0000 mg | ORAL_TABLET | Freq: Every day | ORAL | 11 refills | Status: DC

## 2018-03-31 NOTE — Patient Instructions (Signed)
Follow-Up: You will need a follow up appointment in 6 MONTHS.  Please call our office 2 months in advance(MAR 2020) to schedule the (MAY 2020) appointment.  You may see  DR [place], Kathryn Lawrence, DNP, AACC -or- one of the following Advanced Practice Providers on your designated Care Team:    . Almyra Deforest, PA-C . Fabian Sharp, PA-C  Medication Instructions:  NO CHANGES- Your physician recommends that you continue on your current medications as directed. Please refer to the Current Medication list given to you today.  If you need a refill on your cardiac medications before your next appointment, please call your pharmacy.  Labwork: If you have labs (blood work) drawn today and your tests are completely normal, you will receive your results ONLY by: . MyChart Message (if you have MyChart) -OR- . A paper copy in the mail  At [facility], you and your health needs are our priority.  As part of our continuing mission to provide you with exceptional heart care, we have created designated Provider Care Teams.  These Care Teams include your primary Cardiologist (physician) and Advanced Practice Providers (APPs -  Physician Assistants and Nurse Practitioners) who all work together to provide you with the care you need, when you need it.  Thank you for choosing [facility] at [facility]!!

## 2018-04-18 ENCOUNTER — Telehealth: Payer: Self-pay | Admitting: Adult Health

## 2018-04-18 MED ORDER — METOPROLOL SUCCINATE ER 50 MG PO TB24: ORAL_TABLET | ORAL | 0 refills | Status: DC

## 2018-04-18 MED ORDER — METOPROLOL SUCCINATE ER 50 MG PO TB24: 25.0000 mg | ORAL_TABLET | Freq: Every day | ORAL | 0 refills | Status: DC

## 2018-04-18 NOTE — Telephone Encounter (Signed)
Spoke with patient who reports she is having issues with metoprolol succinate. She was halving 50mg  tablets and doing OK and is now having issues with the 25mg  tablets. She reports she wakes up with a sore throat in the AM, heart is racing, chest pressure. She is not monitoring BP or HR. Explained that suggesting med changes is not feasible without VS readings. Explained that if heart is racing, this could cause chest pressure. She would like to try 50mg  tablets again and cut in half. Rx(s) sent to pharmacy electronically. Advised to see ED evaluation if symptoms do not improve.

## 2018-04-18 NOTE — Telephone Encounter (Signed)
New Message         Pt c/o medication issue:  1. Name of Medication: Metoprolol 25 mg  2. How are you currently taking this medication (dosage and times per day)? 1 x a day  3. Are you having a reaction (difficulty breathing--STAT)? No  4. What is your medication issue? Patient states that since she has being taking the straight 25 mg her heart has being racing and low energy, chest pain pressure.

## 2018-04-18 NOTE — Telephone Encounter (Signed)
Thank you :)

## 2018-06-03 ENCOUNTER — Telehealth: Payer: Self-pay | Admitting: Adult Health

## 2018-06-03 NOTE — Telephone Encounter (Signed)
Okay to take both medications as prescribed.

## 2018-06-03 NOTE — Telephone Encounter (Signed)
° ° °  Pt c/o medication issue:  1. Name of Medication: Prednisone  2. How are you currently taking this medication (dosage and times per day)? As written   3. Are you having a reaction (difficulty breathing--STAT)? no  4. What is your medication issue? Patient wants to know if she should be taking Prednisone with Metoprolol

## 2018-06-03 NOTE — Telephone Encounter (Signed)
Called patient, advised of note from PharmD.

## 2018-06-03 NOTE — Telephone Encounter (Signed)
Can you please advise?  Thank you!   Prednisone 10 mg: 3 tablets by mouth once daily.

## 2018-06-25 ENCOUNTER — Telehealth: Payer: Self-pay | Admitting: Adult Health

## 2018-06-25 NOTE — Telephone Encounter (Signed)
New Message    Pt c/o medication issue:  1. Name of Medication: Metoprolol 50mg    2. How are you currently taking this medication (dosage and times per day)? Half of 50mg  tablet at night   3. Are you having a reaction (difficulty breathing--STAT)? No   4. What is your medication issue? Patient states she can tell a difference in the medication since the pill has changed.

## 2018-06-25 NOTE — Telephone Encounter (Signed)
Spoke with pt, she states since she picked up her refill and the changed the pill shape, she is more tired and having more fluttering feeling. Pt states she doesn't check her HR or Blood pressure and denies changes in her diet. Explained to pt that the medication is the same but just a different manufacturer. Advised pt to contact pharmacy to see if they have previous refill in stock and see if that helps with her symptoms. Pt verbalized understanding.

## 2018-07-02 ENCOUNTER — Telehealth: Payer: Self-pay | Admitting: Adult Health

## 2018-07-02 NOTE — Telephone Encounter (Signed)
New Message:    Patient calling concerning some medications takes and would like to know if there is something she can take over the counter. Please call patient.

## 2018-07-02 NOTE — Telephone Encounter (Signed)
Only active medication is metoprolol succinate.  Recommendation:  1. Take medication with food to decrease potential for GI upset  2. Okay to take famotidine 20mg  OTC daily for 1 week OR TUMS as needed OR  nexium 20mg  daily for 1 week.  3. Avoid spicy food and fried food for few days until acid reflux improved.

## 2018-07-02 NOTE — Telephone Encounter (Signed)
Patient states she has had some issues with acid reflux and would like to take some OTC medication, but does not want to interfere with her current medication.  Can you please give advise?  Thank you!

## 2018-07-02 NOTE — Telephone Encounter (Signed)
Called patient, gave advice from PharmD. Patient verbalized understanding.

## 2018-07-10 ENCOUNTER — Other Ambulatory Visit: Payer: Self-pay | Admitting: Adult Health

## 2018-07-11 NOTE — Telephone Encounter (Signed)
Rx(s) sent to pharmacy electronically.  

## 2018-12-23 ENCOUNTER — Other Ambulatory Visit: Payer: Self-pay

## 2018-12-23 ENCOUNTER — Emergency Department (HOSPITAL_COMMUNITY)
Admission: EM | Admit: 2018-12-23 | Discharge: 2018-12-23 | Disposition: A | Discharge status: Home / Self Care | Payer: 59 | Attending: Emergency Medicine | Admitting: Emergency Medicine

## 2018-12-23 DIAGNOSIS — Y929 Unspecified place or not applicable: Secondary | ICD-10-CM | POA: Diagnosis not present

## 2018-12-23 DIAGNOSIS — S50811A Abrasion of right forearm, initial encounter: Secondary | ICD-10-CM | POA: Diagnosis present

## 2018-12-23 DIAGNOSIS — Y939 Activity, unspecified: Secondary | ICD-10-CM | POA: Diagnosis not present

## 2018-12-23 DIAGNOSIS — M7918 Myalgia, other site: Secondary | ICD-10-CM

## 2018-12-23 DIAGNOSIS — Z79899 Other long term (current) drug therapy: Secondary | ICD-10-CM | POA: Diagnosis not present

## 2018-12-23 DIAGNOSIS — Z87891 Personal history of nicotine dependence: Secondary | ICD-10-CM | POA: Insufficient documentation

## 2018-12-23 DIAGNOSIS — T148XXA Other injury of unspecified body region, initial encounter: Secondary | ICD-10-CM

## 2018-12-23 DIAGNOSIS — Z23 Encounter for immunization: Secondary | ICD-10-CM | POA: Insufficient documentation

## 2018-12-23 DIAGNOSIS — M25511 Pain in right shoulder: Secondary | ICD-10-CM | POA: Diagnosis not present

## 2018-12-23 DIAGNOSIS — I10 Essential (primary) hypertension: Secondary | ICD-10-CM | POA: Diagnosis not present

## 2018-12-23 DIAGNOSIS — Y999 Unspecified external cause status: Secondary | ICD-10-CM | POA: Insufficient documentation

## 2018-12-23 MED ORDER — TETANUS-DIPHTH-ACELL PERTUSSIS 5-2.5-18.5 LF-MCG/0.5 IM SUSP
0.5000 mL | Freq: Once | INTRAMUSCULAR | Status: AC
  Administered 2018-12-23: 0.5 mL via INTRAMUSCULAR
  Filled 2018-12-23: qty 0.5

## 2018-12-23 MED ORDER — CYCLOBENZAPRINE HCL 10 MG PO TABS: 10.0000 mg | ORAL_TABLET | Freq: Two times a day (BID) | ORAL | 0 refills | Status: DC | PRN
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
## 2018-12-23 NOTE — Discharge Instructions (Signed)
Please read attached information. If you experience any new or worsening signs or symptoms please return to the emergency room for evaluation. Please follow-up with your primary care provider or specialist as discussed. Please use medication prescribed only as directed and discontinue taking if you have any concerning signs or symptoms.   °
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
## 2019-07-08 ENCOUNTER — Other Ambulatory Visit: Payer: Self-pay | Admitting: Cardiology

## 2019-07-09 NOTE — Telephone Encounter (Signed)
Rx has been sent to the pharmacy electronically. ° °

## 2019-08-03 ENCOUNTER — Other Ambulatory Visit: Payer: Self-pay | Admitting: Cardiology

## 2019-08-20 ENCOUNTER — Ambulatory Visit: Payer: 59 | Attending: Internal Medicine

## 2019-08-20 DIAGNOSIS — Z23 Encounter for immunization: Secondary | ICD-10-CM

## 2019-08-20 NOTE — Progress Notes (Signed)
   Covid-19 Vaccination Clinic  Name:  Candace Hall    MRN: [id number] DOB: November 26, 1960  08/20/2019  [name] was observed post Covid-19 immunization for 15 minutes without incident. She was provided with Vaccine Information Sheet and instruction to access the V-Safe system.   [name] was instructed to call 911 with any severe reactions post vaccine: Marland Kitchen Difficulty breathing  . Swelling of face and throat  . A fast heartbeat  . A bad rash all over body  . Dizziness and weakness   Immunizations Administered    Name Date Dose VIS Date Route   Pfizer COVID-19 Vaccine 08/20/2019  8:53 AM 0.3 mL 04/24/2019 Intramuscular   Manufacturer: [place]   Lot: [id number]   [place]: [id number]

## 2019-09-03 ENCOUNTER — Other Ambulatory Visit: Payer: Self-pay | Admitting: Cardiology

## 2019-09-04 NOTE — Telephone Encounter (Signed)
Rx(s) sent to pharmacy electronically.  

## 2019-09-14 ENCOUNTER — Ambulatory Visit: Payer: 59 | Attending: Internal Medicine

## 2019-09-14 DIAGNOSIS — Z23 Encounter for immunization: Secondary | ICD-10-CM

## 2019-09-14 NOTE — Progress Notes (Signed)
   Covid-19 Vaccination Clinic  Name:  Candace Hall    MRN: [id number] DOB: [birth year]  09/14/2019  [name] was observed post Covid-19 immunization for 30 minutes based on pre-vaccination screening without incident. She was provided with Vaccine Information Sheet and instruction to access the V-Safe system.   [name] was instructed to call 911 with any severe reactions post vaccine: Marland Kitchen Difficulty breathing  . Swelling of face and throat  . A fast heartbeat  . A bad rash all over body  . Dizziness and weakness   Immunizations Administered    Name Date Dose VIS Date Route   Pfizer COVID-19 Vaccine 09/14/2019  8:21 AM 0.3 mL 07/08/2018 Intramuscular   Manufacturer: [place]   Lot: [id number]   [place]: [id number]

## 2019-10-02 ENCOUNTER — Other Ambulatory Visit: Payer: Self-pay | Admitting: Cardiology

## 2019-10-05 NOTE — Telephone Encounter (Signed)
*  STAT* If patient is at the pharmacy, call can be transferred to refill team.   1. Which medications need to be refilled? (please list name of each medication and dose if known) *Metoprolol- she need this today please   2. Which pharmacy/location (including street and city if local pharmacy) is medication to be sent to [facility], [place]*  3. Do they need a 30 day or 90 day supply? 15 and refills

## 2019-10-06 ENCOUNTER — Telehealth: Payer: Self-pay | Admitting: Adult Health

## 2019-10-06 MED ORDER — METOPROLOL SUCCINATE ER 50 MG PO TB24: ORAL_TABLET | ORAL | 0 refills | Status: DC

## 2019-10-06 NOTE — Telephone Encounter (Signed)
Appointment schedule for June 15th @ 3:45 pm

## 2019-10-06 NOTE — Telephone Encounter (Signed)
Pt c/o medication issue:  1. Name of Medication: metoprolol succinate (TOPROL-XL) 50 MG 24 hr tablet  2. How are you currently taking this medication (dosage and times per day)?   3. Are you having a reaction (difficulty breathing--STAT)?   4. What is your medication issue? [facility] is calling stating the pharmacy stated this medication needs prior authorization before being filled. Nayellie took her last tablet yesterday and is currently out of this medication. Please advise.

## 2019-10-06 NOTE — Telephone Encounter (Signed)
Called patients pharmacy regarding the prior auth. The pharm tech states her refill was denied. Pt has not been seen for a follow up since 2019. I sent in another 30 days with request pt call and make an appt with her provider.  Left detailed VM with pt with my findings and requested she call for a follow up appt.

## 2019-10-07 ENCOUNTER — Other Ambulatory Visit: Payer: Self-pay

## 2019-10-07 MED ORDER — METOPROLOL SUCCINATE ER 50 MG PO TB24: ORAL_TABLET | ORAL | 0 refills | Status: DC

## 2019-10-26 NOTE — Progress Notes (Signed)
Cardiology Office Note   Date:  10/26/2019   ID:  Candace Hall, DOB 1960/08/09, MRN [id number]  PCP:  Clent Demark, PA-C  Cardiologist:  Dr. Martinique  CC: Follow Up   History of Present Illness: Candace Hall is a 59 y.o. female who presents for ongoing assessment and management of SVT, and hypertension.  She has been followed by Dr. Lovena Le in 2019 and was continued on low-dose Lopressor and encouraged avoid caffeine.  She was advised to take beta-blocker at at bedtime that she was having some fatigue during the day after taking it.  She denied any rapid heart rhythms or chest discomfort on last office visit when I saw her on 03/31/2018.  She did complain of some mild musculoskeletal back pain otherwise there were no symptoms.  No changes were made in her medication regimen.  She is here for follow-up.    She comes today without any complaints.  She works as a Licensed conveyancer and they are slowly opening up [employer] to [employer] as the pandemic restrictions are being lifted.  She does not have any complaints of rapid heart rate or palpitations.  The medication appears to be working well for her.  She has not had any hospitalizations or new diagnoses since being seen last.    Past Medical History:  Diagnosis Date  . Allergy   . Anxiety   . Cataract   . Hypertension     Past Surgical History:  Procedure Laterality Date  . BREAST CYST EXCISION    . BREAST EXCISIONAL BIOPSY Right    benign  . CESAREAN SECTION    . CORNEAL TRANSPLANT    . KIDNEY SURGERY     a. s/p left nephrectomy  . TUBAL LIGATION       Current Outpatient Medications  Medication Sig Dispense Refill  . cyclobenzaprine (FLEXERIL) 10 MG tablet Take 1 tablet (10 mg total) by mouth 2 (two) times daily as needed for muscle spasms. 20 tablet 0  . metoprolol succinate (TOPROL-XL) 50 MG 24 hr tablet TAKE 1/2 (ONE-HALF) TABLET BY MOUTH ONCE DAILY. LAST REFILL. PLEASE CALL OFFICE TO SCHEDULE APPT 15 tablet 0    No current facility-administered medications for this visit.    Allergies:   Latex and Penicillins    Social History:  The patient  reports that she quit smoking about 32 years ago. She has never used smokeless tobacco. She reports that she does not drink alcohol and does not use drugs.   Family History:  The patient's family history includes Hypertension in her brother and mother; Other (age of onset: 50) in her brother; Stroke (age of onset: 35) in her mother; Ulcers in her father.    ROS: All other systems are reviewed and negative. Unless otherwise mentioned in H&P    PHYSICAL EXAM: VS:  LMP 01/31/2014  , BMI There is no height or weight on file to calculate BMI. GEN: Well nourished, well developed, in no acute distress HEENT: normal Neck: no JVD, carotid bruits, or masses Cardiac: RRR; no murmurs, rubs, or gallops,no edema  Respiratory:  Clear to auscultation bilaterally, normal work of breathing GI: soft, nontender, nondistended, + BS MS: no deformity or atrophy Skin: warm and dry, no rash Neuro:  Strength and sensation are intact Psych: euthymic mood, full affect   EKG:  NSR rate of 65 bpm. Recent Labs: No results found for requested labs within last 8760 hours.    Lipid Panel    Component Value  Date/Time   CHOL 171 12/27/2016 1626   TRIG 79 12/27/2016 1626   HDL 53 12/27/2016 1626   CHOLHDL 3.2 12/27/2016 1626   LDLCALC 102 (H) 12/27/2016 1626      Wt Readings from Last 3 Encounters:  03/31/18 181 lb 9.6 oz (82.4 kg)  02/12/18 183 lb 8 oz (83.2 kg)  02/06/18 180 lb (81.6 kg)      Other studies Reviewed: Echocardiogram 2017-11-25 Left ventricle: The cavity size was normal. There was mild focal basal hypertrophy of the septum. Systolic function was normal. The estimated ejection fraction was in the range of 55% to 60%. Wall motion was normal; there were no regional wall motion abnormalities. Features are consistent with a pseudonormal  left ventricular filling pattern, with concomitant abnormal relaxation and increased filling pressure (grade 2 diastolic dysfunction). - Aortic valve: Transvalvular velocity was within the normal range. There was no stenosis. There was no regurgitation. - Mitral valve: Transvalvular velocity was within the normal range. There was no evidence for stenosis. - Right ventricle: The cavity size was normal. Wall thickness was normal. Systolic function was normal. - Atrial septum: No defect or patent foramen ovale was identified by color flow Doppler. - Tricuspid valve: There was trivial regurgitation. - Pulmonary arteries: Systolic pressure was within the normal range. PA peak pressure: 17 mm Hg (S).    ASSESSMENT AND PLAN:  1.  PSVT: Continue metoprolol 25 mg daily as directed.  She may take an additional half a tablet as needed for heart rate elevations which are breakthrough.  No labs or additional testing is planned today.  2.  Hypertension: Blood pressure slightly elevated today.  May need to consider adding another agent if it remains greater than 140 persistently.  She is followed by PCP more often.  She is due to see them in 1 month for physical and labs.   Current medicines are reviewed at length with the patient today.  I have spent 20 minutes dedicated to the care of this patient on the date of this encounter to include pre-visit review of records, assessment, management and diagnostic testing,with shared decision making.  Labs/ tests ordered today include: None  Phill Myron. West Pugh, ANP, AACC   10/26/2019 11:15 AM    [address] Office [phone] Fax [phone]  Notice: This dictation was prepared with Dragon dictation along with smaller phrase technology. Any transcriptional errors that result from this process are unintentional and may not be corrected upon review.

## 2019-10-27 ENCOUNTER — Ambulatory Visit: Payer: 59 | Admitting: Adult Health

## 2019-10-27 ENCOUNTER — Other Ambulatory Visit: Payer: Self-pay

## 2019-10-27 ENCOUNTER — Encounter: Payer: Self-pay | Admitting: Adult Health

## 2019-10-27 VITALS — BP 142/90 | HR 65 | Ht 63.0 in | Wt 204.0 lb

## 2019-10-27 DIAGNOSIS — I10 Essential (primary) hypertension: Secondary | ICD-10-CM | POA: Diagnosis not present

## 2019-10-27 DIAGNOSIS — I471 Supraventricular tachycardia: Secondary | ICD-10-CM

## 2019-10-27 MED ORDER — METOPROLOL SUCCINATE ER 50 MG PO TB24: 25.0000 mg | ORAL_TABLET | Freq: Every day | ORAL | 3 refills | Status: DC

## 2019-10-27 NOTE — Patient Instructions (Signed)
Medication Instructions:  Continue current medications  *If you need a refill on your cardiac medications before your next appointment, please call your pharmacy*   Lab Work: None Ordered   Testing/Procedures: None Ordered   Follow-Up: At [facility], you and your health needs are our priority.  As part of our continuing mission to provide you with exceptional heart care, we have created designated Provider Care Teams.  These Care Teams include your primary Cardiologist (physician) and Advanced Practice Providers (APPs -  Physician Assistants and Nurse Practitioners) who all work together to provide you with the care you need, when you need it.  We recommend signing up for the patient portal called "MyChart".  Sign up information is provided on this After Visit Summary.  MyChart is used to connect with patients for Virtual Visits (Telemedicine).  Patients are able to view lab/test results, encounter notes, upcoming appointments, etc.  Non-urgent messages can be sent to your provider as well.   To learn more about what you can do with MyChart, go to [email or web address].    Your next appointment:   1 Year  The format for your next appointment:   In Person  Provider:   You may see Peter Martinique, MD or one of the following Advanced Practice Providers on your designated Care Team:    Almyra Deforest, PA-C  Fabian Sharp, PA-C or   Roby Lofts, [place]

## 2020-09-05 ENCOUNTER — Telehealth: Payer: Self-pay | Admitting: Cardiology

## 2020-09-05 NOTE — Telephone Encounter (Signed)
Returned call to patient of Dr. Martinique who reports palpitations for weeks - not daily, but off and on. No identifiable triggers. She does not consume caffeine. She reports lightheadedness, feeling hot, excess sweating (in face)   She does not check BP/pulse at home.   She is compliant with metoprolol succinate 50mg  use and NO other meds  Advised if symptoms worsen, if she develops CP, shortness of breath, seek ED/urgent care eval.   Advised if able, to monitor BP and pulse at home, or at least during episodes  Will notify MD as Juluis Rainier - scheduled for 5/17 with [place] PA

## 2020-09-05 NOTE — Telephone Encounter (Signed)
Patient c/o Palpitations:  High priority if patient c/o lightheadedness, shortness of breath, or chest pain  1) How long have you had palpitations/irregular HR/ Afib? Are you having the symptoms now? Palpitations for a couple of weeks  2) Are you currently experiencing lightheadedness, SOB or CP? Lightheaded when palpitations happen  3) Do you have a history of afib (atrial fibrillation) or irregular heart rhythm? no  4) Have you checked your BP or HR? (document readings if available): good  5) Are you experiencing any other symptoms? feels hot all the tme, sometimes with sweating- pt wanted to be seen- I made an appt for -17-22 with Isaac Laud

## 2020-09-27 ENCOUNTER — Other Ambulatory Visit: Payer: Self-pay

## 2020-09-27 ENCOUNTER — Ambulatory Visit: Payer: 59 | Admitting: Physician Assistant

## 2020-09-27 ENCOUNTER — Encounter: Payer: Self-pay | Admitting: Physician Assistant

## 2020-09-27 ENCOUNTER — Encounter: Payer: Self-pay | Admitting: *Deleted

## 2020-09-27 VITALS — BP 120/90 | HR 62 | Ht 63.0 in | Wt 195.6 lb

## 2020-09-27 DIAGNOSIS — R61 Generalized hyperhidrosis: Secondary | ICD-10-CM | POA: Diagnosis not present

## 2020-09-27 DIAGNOSIS — R002 Palpitations: Secondary | ICD-10-CM

## 2020-09-27 DIAGNOSIS — I10 Essential (primary) hypertension: Secondary | ICD-10-CM | POA: Diagnosis not present

## 2020-09-27 DIAGNOSIS — M898X1 Other specified disorders of bone, shoulder: Secondary | ICD-10-CM

## 2020-09-27 MED ORDER — METOPROLOL SUCCINATE ER 50 MG PO TB24: 25.0000 mg | ORAL_TABLET | Freq: Every day | ORAL | 3 refills | Status: DC

## 2020-09-27 NOTE — Progress Notes (Signed)
Cardiology Office Note:    Date:  09/29/2020   ID:  [name], DOB Apr 09, 1961, MRN [id number]  PCP:  Elinor Parkinson   [facility] Providers Cardiologist:  Peter Martinique, MD     Referring MD: Sue Lush, PA-C   Chief Complaint  Patient presents with  . Follow-up    Seen for Dr. Martinique    History of Present Illness:    DEASIAH HAGBERG is a 60 y.o. female with a hx of SVT, anxiety and hypertension.  She was seen by Dr. Lovena Le in 2019 and continued on low-dose Lopressor and encouraged to avoid caffeine.  She does have fatigue when she takes the beta-blocker during the day, it was recommended she take it at night.  In the past few years, she has been seen by Jory Sims, NP, last visit was 10/27/2019 at which time she was doing well.  She works as a Manufacturing systems engineer.  More recently, patient called cardiology office on 09/05/2020 complaining of intermittent palpitation for weeks.  Patient presents today for with nonspecific complaints.  She says she gets palpitation once every few weeks, this usually last about 5 minutes before going away.  At this time, she has been having nightly episodes of pouring sweat and dizziness.  She does not check her blood pressure and heart rate at the time.  I asked her to start checking blood pressure and heart rate during the episode.  She says she did have her menopause around 2012.  I recommended a 30-day monitor to assess the frequency of SVT and also to see if her nighttime sweat and dizziness could be related to arrhythmia.  She also complained of pain behind her left shoulder blade.  She only notices this at night and does not notice it with exertion during the day.  If feels better after she rolled her clothe into a ball and put it under the left shoulder blade.  This sounds like a musculoskeletal pain.  I recommend she continue to monitor it and if it worsens or if it becomes more correlated with physical activity, she would need  to let us know and we could consider nuclear stress test.  Otherwise, she plans to go to [facility] with her husband on June 2 for 3-day vacation.  This is the first time she has ever flew on a plane.  She can start wearing the heart monitor after she returns.  We will see her in mid July after her heart monitor has completed.  Past Medical History:  Diagnosis Date  . Allergy   . Anxiety   . Cataract   . Hypertension     Past Surgical History:  Procedure Laterality Date  . BREAST CYST EXCISION    . BREAST EXCISIONAL BIOPSY Right    benign  . CESAREAN SECTION    . CORNEAL TRANSPLANT    . KIDNEY SURGERY     a. s/p left nephrectomy  . TUBAL LIGATION      Current Medications: Current Meds  Medication Sig  . [DISCONTINUED] metoprolol succinate (TOPROL-XL) 50 MG 24 hr tablet Take 0.5 tablets (25 mg total) by mouth daily.     Allergies:   Latex and Penicillins   Social History   Socioeconomic History  . Marital status: Married    Spouse name: Not on file  . Number of children: Not on file  . Years of education: Not on file  . Highest education level: Not on file  Occupational  History  . Not on file  Tobacco Use  . Smoking status: Former Smoker    Quit date: 05/15/1987    Years since quitting: 33.4  . Smokeless tobacco: Never Used  Vaping Use  . Vaping Use: Never used  Substance and Sexual Activity  . Alcohol use: No  . Drug use: No  . Sexual activity: Yes  Other Topics Concern  . Not on file  Social History Narrative  . Not on file   Social Determinants of Health   Financial Resource Strain: Not on file  Food Insecurity: Not on file  Transportation Needs: Not on file  Physical Activity: Not on file  Stress: Not on file  Social Connections: Not on file     Family History: The patient's family history includes Hypertension in her brother and mother; Other (age of onset: 46) in her brother; Stroke (age of onset: 3) in her mother; Ulcers in her  father.  ROS:   Please see the history of present illness.     All other systems reviewed and are negative.  EKGs/Labs/Other Studies Reviewed:    The following studies were reviewed today:  Echo 11/01/2017 LV EF: 55% -  60%   -------------------------------------------------------------------  Indications:   Palpitation (R00.2).   -------------------------------------------------------------------  History:  PMH: No prior cardiac history. Risk factors: Former  tobacco use.   -------------------------------------------------------------------  Study Conclusions   - Left ventricle: The cavity size was normal. There was mild focal  basal hypertrophy of the septum. Systolic function was normal.  The estimated ejection fraction was in the range of 55% to 60%.  Wall motion was normal; there were no regional wall motion  abnormalities. Features are consistent with a pseudonormal left  ventricular filling pattern, with concomitant abnormal relaxation  and increased filling pressure (grade 2 diastolic dysfunction).  - Aortic valve: Transvalvular velocity was within the normal range.  There was no stenosis. There was no regurgitation.  - Mitral valve: Transvalvular velocity was within the normal range.  There was no evidence for stenosis.  - Right ventricle: The cavity size was normal. Wall thickness was  normal. Systolic function was normal.  - Atrial septum: No defect or patent foramen ovale was identified  by color flow Doppler.  - Tricuspid valve: There was trivial regurgitation.  - Pulmonary arteries: Systolic pressure was within the normal  range. PA peak pressure: 17 mm Hg (S).   EKG:  EKG is ordered today.  The ekg ordered today demonstrates normal sinus rhythm, no significant ST-T wave changes.  Recent Labs: No results found for requested labs within last 8760 hours.  Recent Lipid Panel    Component Value Date/Time   CHOL 171 12/27/2016  1626   TRIG 79 12/27/2016 1626   HDL 53 12/27/2016 1626   CHOLHDL 3.2 12/27/2016 1626   LDLCALC 102 (H) 12/27/2016 1626     Risk Assessment/Calculations:       Physical Exam:    VS:  BP 120/90   Pulse 62   Ht 5\' 3"  (1.6 m)   Wt 195 lb 9.6 oz (88.7 kg)   LMP 01/31/2014   SpO2 96%   BMI 34.65 kg/m     Wt Readings from Last 3 Encounters:  09/27/20 195 lb 9.6 oz (88.7 kg)  10/27/19 204 lb (92.5 kg)  03/31/18 181 lb 9.6 oz (82.4 kg)     GEN:  Well nourished, well developed in no acute distress HEENT: Normal NECK: No JVD; No carotid bruits LYMPHATICS: No lymphadenopathy  CARDIAC: RRR, no murmurs, rubs, gallops RESPIRATORY:  Clear to auscultation without rales, wheezing or rhonchi  ABDOMEN: Soft, non-tender, non-distended MUSCULOSKELETAL:  No edema; No deformity  SKIN: Warm and dry NEUROLOGIC:  Alert and oriented x 3 PSYCHIATRIC:  Normal affect   ASSESSMENT:    1. Palpitations   2. Primary hypertension   3. Night sweat   4. Shoulder blade pain    PLAN:    In order of problems listed above:  1. Palpitation: She has a history of SVT.  She still feel palpitation once every few weeks, however she has been having night sweats and dizziness recently.  I recommended a heart monitor to make sure she does not have significant arrhythmia.  2. Hypertension: Blood pressure stable  3. Night sweats: Accompanied by dizziness.  I recommended a 30-day heart monitor.  4. Left shoulder blade pain: Symptom improved after she roll her clothe into a ball and place under her shoulder blade.  This sounds like musculoskeletal pain.  She will contact us and let us know if her symptoms become more related to physical activity, if so I likely will order nuclear stress test.        Medication Adjustments/Labs and Tests Ordered: Current medicines are reviewed at length with the patient today.  Concerns regarding medicines are outlined above.  Orders Placed This Encounter  Procedures  .  CARDIAC EVENT MONITOR  . EKG 12-Lead   Meds ordered this encounter  Medications  . metoprolol succinate (TOPROL-XL) 50 MG 24 hr tablet    Sig: Take 0.5 tablets (25 mg total) by mouth daily.    Dispense:  45 tablet    Refill:  3    Patient Instructions  Medication Instructions:  Your physician recommends that you continue on your current medications as directed. Please refer to the Current Medication list given to you today.  *If you need a refill on your cardiac medications before your next appointment, please call your pharmacy*  Lab Work: NONE ordered at this time of appointment   If you have labs (blood work) drawn today and your tests are completely normal, you will receive your results only by: Marland Kitchen MyChart Message (if you have MyChart) OR . A paper copy in the mail If you have any lab test that is abnormal or we need to change your treatment, we will call you to review the results.  Testing/Procedures: [employer] Cardiac Event Monitor Instructions Your physician has requested you wear your cardiac event monitor for 30 days, (1-30). [employer] may call or text to confirm a shipping address. The monitor will be sent to a land address via UPS. [employer] will not ship a monitor to a PO BOX. It typically takes 3-5 days to receive your monitor after it has been enrolled. [employer] will assist with USPS tracking if your package is delayed. The telephone number for [employer] is [phone]. Once you have received your monitor, please review the enclosed instructions. Instruction tutorials can also be viewed under help and settings on the enclosed cell phone. Your monitor has already been registered assigning a specific monitor serial # to you.  Applying the monitor Remove cell phone from case and turn it on. The cell phone works as Dealer and needs to be within [employer] of you at all times. The cell phone will need to be charged on a daily basis. We recommend you plug  the cell phone into the enclosed charger at your bedside table every night.  Monitor batteries: You  will receive two monitor batteries labelled #1 and #2. These are your recorders. Plug battery #2 onto the second connection on the enclosed charger. Keep one battery on the charger at all times. This will keep the monitor battery deactivated. It will also keep it fully charged for when you need to switch your monitor batteries. A small light will be blinking on the battery emblem when it is charging. The light on the battery emblem will remain on when the battery is fully charged.  Open package of a Monitor strip. Insert battery #1 into black hood on strip and gently squeeze monitor battery onto connection as indicated in instruction booklet. Set aside while preparing skin.  Choose location for your strip, vertical or horizontal, as indicated in the instruction booklet. Shave to remove all hair from location. There cannot be any lotions, oils, powders, or colognes on skin where monitor is to be applied. Wipe skin clean with enclosed Saline wipe. Dry skin completely.  Peel paper labeled #1 off the back of the Monitor strip exposing the adhesive. Place the monitor on the chest in the vertical or horizontal position shown in the instruction booklet. One arrow on the monitor strip must be pointing upward. Carefully remove paper labeled #2, attaching remainder of strip to your skin. Try not to create any folds or wrinkles in the strip as you apply it.  Firmly press and release the circle in the center of the monitor battery. You will hear a small beep. This is turning the monitor battery on. The heart emblem on the monitor battery will light up every 5 seconds if the monitor battery in turned on and connected to the patient securely. Do not push and hold the circle down as this turns the monitor battery off. The cell phone will locate the monitor battery. A screen will appear on the cell phone  checking the connection of your monitor strip. This may read poor connection initially but change to good connection within the next minute. Once your monitor accepts the connection you will hear a series of 3 beeps followed by a climbing crescendo of beeps. A screen will appear on the cell phone showing the two monitor strip placement options. Touch the picture that demonstrates where you applied the monitor strip.  Your monitor strip and battery are waterproof. You are able to shower, bathe, or swim with the monitor on. They just ask you do not submerge deeper than 3 feet underwater. We recommend removing the monitor if you are swimming in a lake, river, or ocean.  Your monitor battery will need to be switched to a fully charged monitor battery approximately once a week. The cell phone will alert you of an action which needs to be made.  On the cell phone, tap for details to reveal connection status, monitor battery status, and cell phone battery status. The green dots indicates your monitor is in good status. A red dot indicates there is something that needs your attention.  To record a symptom, click the circle on the monitor battery. In 30-60 seconds a list of symptoms will appear on the cell phone. Select your symptom and tap save. Your monitor will record a sustained or significant arrhythmia regardless of you clicking the button. Some patients do not feel the heart rhythm irregularities. [employer] will notify us of any serious or critical events.  Refer to instruction booklet for instructions on switching batteries, changing strips, the Do not disturb or Pause features, or any additional questions.  Call  [employer] at [phone], to confirm your monitor is transmitting and record your baseline. They will answer any questions you may have regarding the monitor instructions at that time.  Returning the monitor to [place] all equipment back into blue box. Peel off strip  of paper to expose adhesive and close box securely. There is a prepaid UPS shipping label on this box. Drop in a UPS drop box, or at a UPS facility like [employer]. You may also contact [employer] to arrange UPS to pick up monitor package at your home.   Follow-Up: At [facility], you and your health needs are our priority.  As part of our continuing mission to provide you with exceptional heart care, we have created designated Provider Care Teams.  These Care Teams include your primary Cardiologist (physician) and Advanced Practice Providers (APPs -  Physician Assistants and Nurse Practitioners) who all work together to provide you with the care you need, when you need it.  Your next appointment:   9 week(s)  The format for your next appointment:   In Person  Provider:   Almyra Deforest, PA-C  Other Instructions  Continue to monitor Left shoulder pain. If the pain increases with activity or with intensity give our office a call        Signed, Almyra Deforest, [place]  09/29/2020 9:39 PM    [place]

## 2020-09-27 NOTE — Patient Instructions (Signed)
Medication Instructions:  Your physician recommends that you continue on your current medications as directed. Please refer to the Current Medication list given to you today.  *If you need a refill on your cardiac medications before your next appointment, please call your pharmacy*  Lab Work: NONE ordered at this time of appointment   If you have labs (blood work) drawn today and your tests are completely normal, you will receive your results only by: Marland Kitchen MyChart Message (if you have MyChart) OR . A paper copy in the mail If you have any lab test that is abnormal or we need to change your treatment, we will call you to review the results.  Testing/Procedures: [employer] Cardiac Event Monitor Instructions Your physician has requested you wear your cardiac event monitor for 30 days, (1-30). [employer] may call or text to confirm a shipping address. The monitor will be sent to a land address via UPS. [employer] will not ship a monitor to a PO BOX. It typically takes 3-5 days to receive your monitor after it has been enrolled. [employer] will assist with USPS tracking if your package is delayed. The telephone number for [employer] is [phone]. Once you have received your monitor, please review the enclosed instructions. Instruction tutorials can also be viewed under help and settings on the enclosed cell phone. Your monitor has already been registered assigning a specific monitor serial # to you.  Applying the monitor Remove cell phone from case and turn it on. The cell phone works as Dealer and needs to be within [employer] of you at all times. The cell phone will need to be charged on a daily basis. We recommend you plug the cell phone into the enclosed charger at your bedside table every night.  Monitor batteries: You will receive two monitor batteries labelled #1 and #2. These are your recorders. Plug battery #2 onto the second connection on the enclosed charger. Keep one  battery on the charger at all times. This will keep the monitor battery deactivated. It will also keep it fully charged for when you need to switch your monitor batteries. A small light will be blinking on the battery emblem when it is charging. The light on the battery emblem will remain on when the battery is fully charged.  Open package of a Monitor strip. Insert battery #1 into black hood on strip and gently squeeze monitor battery onto connection as indicated in instruction booklet. Set aside while preparing skin.  Choose location for your strip, vertical or horizontal, as indicated in the instruction booklet. Shave to remove all hair from location. There cannot be any lotions, oils, powders, or colognes on skin where monitor is to be applied. Wipe skin clean with enclosed Saline wipe. Dry skin completely.  Peel paper labeled #1 off the back of the Monitor strip exposing the adhesive. Place the monitor on the chest in the vertical or horizontal position shown in the instruction booklet. One arrow on the monitor strip must be pointing upward. Carefully remove paper labeled #2, attaching remainder of strip to your skin. Try not to create any folds or wrinkles in the strip as you apply it.  Firmly press and release the circle in the center of the monitor battery. You will hear a small beep. This is turning the monitor battery on. The heart emblem on the monitor battery will light up every 5 seconds if the monitor battery in turned on and connected to the patient securely. Do not push and hold the  circle down as this turns the monitor battery off. The cell phone will locate the monitor battery. A screen will appear on the cell phone checking the connection of your monitor strip. This may read poor connection initially but change to good connection within the next minute. Once your monitor accepts the connection you will hear a series of 3 beeps followed by a climbing crescendo of beeps. A  screen will appear on the cell phone showing the two monitor strip placement options. Touch the picture that demonstrates where you applied the monitor strip.  Your monitor strip and battery are waterproof. You are able to shower, bathe, or swim with the monitor on. They just ask you do not submerge deeper than 3 feet underwater. We recommend removing the monitor if you are swimming in a lake, river, or ocean.  Your monitor battery will need to be switched to a fully charged monitor battery approximately once a week. The cell phone will alert you of an action which needs to be made.  On the cell phone, tap for details to reveal connection status, monitor battery status, and cell phone battery status. The green dots indicates your monitor is in good status. A red dot indicates there is something that needs your attention.  To record a symptom, click the circle on the monitor battery. In 30-60 seconds a list of symptoms will appear on the cell phone. Select your symptom and tap save. Your monitor will record a sustained or significant arrhythmia regardless of you clicking the button. Some patients do not feel the heart rhythm irregularities. [employer] will notify us of any serious or critical events.  Refer to instruction booklet for instructions on switching batteries, changing strips, the Do not disturb or Pause features, or any additional questions.  Call [employer] at [phone], to confirm your monitor is transmitting and record your baseline. They will answer any questions you may have regarding the monitor instructions at that time.  Returning the monitor to [employer] all equipment back into blue box. Peel off strip of paper to expose adhesive and close box securely. There is a prepaid UPS shipping label on this box. Drop in a UPS drop box, or at a UPS facility like [employer]. You may also contact [employer] to arrange UPS to pick up monitor package at your  home.   Follow-Up: At [facility], you and your health needs are our priority.  As part of our continuing mission to provide you with exceptional heart care, we have created designated Provider Care Teams.  These Care Teams include your primary Cardiologist (physician) and Advanced Practice Providers (APPs -  Physician Assistants and Nurse Practitioners) who all work together to provide you with the care you need, when you need it.  Your next appointment:   9 week(s)  The format for your next appointment:   In Person  Provider:   Almyra Deforest, PA-C  Other Instructions  Continue to monitor Left shoulder pain. If the pain increases with activity or with intensity give our office a call

## 2020-09-27 NOTE — Progress Notes (Signed)
Patient ID: Candace Hall, female   DOB: 06-09-1960, 60 y.o.   MRN: [id number] Patient enrolled for [employer] to ship a 30 day Cardiac event monitor to her home.  [employer] delivery date of monitor is 10/17/2020, when patient returns from vacation.

## 2020-09-29 ENCOUNTER — Encounter: Payer: Self-pay | Admitting: Physician Assistant

## 2020-10-18 ENCOUNTER — Ambulatory Visit (INDEPENDENT_AMBULATORY_CARE_PROVIDER_SITE_OTHER): Payer: 59

## 2020-10-18 DIAGNOSIS — R002 Palpitations: Secondary | ICD-10-CM | POA: Diagnosis not present

## 2020-11-23 ENCOUNTER — Ambulatory Visit: Payer: 59 | Admitting: Physician Assistant

## 2021-01-28 LAB — COLOGUARD: COLOGUARD: NEGATIVE

## 2021-01-28 LAB — EXTERNAL GENERIC LAB PROCEDURE: COLOGUARD: NEGATIVE

## 2021-01-30 ENCOUNTER — Ambulatory Visit: Payer: 59 | Admitting: Physician Assistant

## 2021-01-30 ENCOUNTER — Encounter: Payer: Self-pay | Admitting: Physician Assistant

## 2021-01-30 ENCOUNTER — Other Ambulatory Visit: Payer: Self-pay

## 2021-01-30 VITALS — BP 120/82 | HR 70 | Ht 63.0 in | Wt 197.0 lb

## 2021-01-30 DIAGNOSIS — I10 Essential (primary) hypertension: Secondary | ICD-10-CM | POA: Diagnosis not present

## 2021-01-30 DIAGNOSIS — R002 Palpitations: Secondary | ICD-10-CM

## 2021-01-30 NOTE — Progress Notes (Signed)
Cardiology Office Note:    Date:  02/01/2021   ID:  Candace Hall, DOB [birth year], MRN [id number]  PCP:  Elinor Parkinson   [facility] Providers Cardiologist:  Peter Martinique, MD     Referring MD: Sue Lush, PA-C   Chief Complaint  Patient presents with   Follow-up    Seen for Dr. Martinique    History of Present Illness:    Candace Hall is a 60 y.o. female with a hx of SVT, anxiety and hypertension.  She was seen by Dr. Lovena Le in 2019 and continued on low-dose Lopressor and encouraged to avoid caffeine.  She does have fatigue when she takes the beta-blocker during the day, it was recommended she take it at night.  In the past few years, she has been seen by Jory Sims, NP, last visit was 10/27/2019 at which time she was doing well.  She works as a Manufacturing systems engineer.  More recently, patient called cardiology office on 09/05/2020 complaining of intermittent palpitation for weeks.  I last saw the patient on 09/27/2020 at which time she was having palpitation once every several weeks, this usually last about 5 minutes before going away.  During the episode, she would have point sweats and dizziness.  I recommended a 30-day heart monitor to assess the frequency of SVT and the check to see if her nighttime sweat and the dizziness could be related to arrhythmia.  Patient presents today for follow-up.  We reviewed the recent heart monitor results which had a 16 hours of data.  Unfortunately she was unable to wear the full course due to irritation and rash due to allergic reaction to the skin patch.  Out of the 16 hours of data, there is 1 auto trigger and for manual trigger event.  First auto triggered event is related to the baseline EKG obtained when the heart monitor was first placed.  First manual trigger appears to be accidental push about 6-minute after the heart monitor was placed on.  Three other manual events all appears to be sinus rhythm.  Talking with the patient  today, she did have symptom of palpitation while on the heart monitor.  Given the reassuring monitor result, I recommended continue on the current dose of metoprolol.  No further work-up is recommended.  As far as her left shoulder blade pain, she usually rolls a bedsheet into a roll and put it under the left shoulder blade blade to make it feel better.  The symptom only happens when she lay down and does not occur when she walks around during the day.  Symptom is quite atypical for angina, at this time I decided to hold off on ischemic work-up.  I will bring the patient back in 3 to 4 months, if she has worsening dyspnea on exertion or worsening back pain, may consider additional ischemic work-up.   Past Medical History:  Diagnosis Date   Allergy    Anxiety    Cataract    Hypertension     Past Surgical History:  Procedure Laterality Date   BREAST CYST EXCISION     BREAST EXCISIONAL BIOPSY Right    benign   CESAREAN SECTION     CORNEAL TRANSPLANT     KIDNEY SURGERY     a. s/p left nephrectomy   TUBAL LIGATION      Current Medications: Current Meds  Medication Sig   metoprolol succinate (TOPROL-XL) 50 MG 24 hr tablet Take 0.5 tablets (25 mg total)  by mouth daily.     Allergies:   Latex and Penicillins   Social History   Socioeconomic History   Marital status: Married    Spouse name: Not on file   Number of children: Not on file   Years of education: Not on file   Highest education level: Not on file  Occupational History   Not on file  Tobacco Use   Smoking status: Former    Types: Cigarettes    Quit date: 05/15/1987    Years since quitting: 33.7   Smokeless tobacco: Never  Vaping Use   Vaping Use: Never used  Substance and Sexual Activity   Alcohol use: No   Drug use: No   Sexual activity: Yes  Other Topics Concern   Not on file  Social History Narrative   Not on file   Social Determinants of Health   Financial Resource Strain: Not on file  Food Insecurity:  Not on file  Transportation Needs: Not on file  Physical Activity: Not on file  Stress: Not on file  Social Connections: Not on file     Family History: The patient's family history includes Hypertension in her brother and mother; Other (age of onset: 97) in her brother; Stroke (age of onset: 3) in her mother; Ulcers in her father.  ROS:   Please see the history of present illness.     All other systems reviewed and are negative.  EKGs/Labs/Other Studies Reviewed:    The following studies were reviewed today:  Event monitor 11/18/2020 Normal sinus rhythm. range 52-144. average HR 72. No arrhythmia noted  EKG:  EKG is not ordered today.    Recent Labs: No results found for requested labs within last 8760 hours.  Recent Lipid Panel    Component Value Date/Time   CHOL 171 12/27/2016 1626   TRIG 79 12/27/2016 1626   HDL 53 12/27/2016 1626   CHOLHDL 3.2 12/27/2016 1626   LDLCALC 102 (H) 12/27/2016 1626     Risk Assessment/Calculations:           Physical Exam:    VS:  BP 120/82   Pulse 70   Ht '5\' 3"'$  (1.6 m)   Wt 197 lb (89.4 kg)   LMP 01/31/2014   SpO2 98%   BMI 34.90 kg/m     Wt Readings from Last 3 Encounters:  01/30/21 197 lb (89.4 kg)  09/27/20 195 lb 9.6 oz (88.7 kg)  10/27/19 204 lb (92.5 kg)     GEN:  Well nourished, well developed in no acute distress HEENT: Normal NECK: No JVD; No carotid bruits LYMPHATICS: No lymphadenopathy CARDIAC: RRR, no murmurs, rubs, gallops RESPIRATORY:  Clear to auscultation without rales, wheezing or rhonchi  ABDOMEN: Soft, non-tender, non-distended MUSCULOSKELETAL:  No edema; No deformity  SKIN: Warm and dry NEUROLOGIC:  Alert and oriented x 3 PSYCHIATRIC:  Normal affect   ASSESSMENT:    1. Palpitations   2. Essential hypertension    PLAN:    In order of problems listed above:  Palpitation: Despite the presence of palpitation, recent heart monitor shows sinus rhythm without significant irregular rhythm.   Continue observation.  No further work-up needed at this time  Hypertension: Blood pressure well controlled on current therapy.        Medication Adjustments/Labs and Tests Ordered: Current medicines are reviewed at length with the patient today.  Concerns regarding medicines are outlined above.  No orders of the defined types were placed in this encounter.  No  orders of the defined types were placed in this encounter.   Patient Instructions  Medication Instructions:  Your physician recommends that you continue on your current medications as directed. Please refer to the Current Medication list given to you today.  *If you need a refill on your cardiac medications before your next appointment, please call your pharmacy*  Lab Work: NONE ordered at this time of appointment   If you have labs (blood work) drawn today and your tests are completely normal, you will receive your results only by: [place] (if you have MyChart) OR A paper copy in the mail If you have any lab test that is abnormal or we need to change your treatment, we will call you to review the results.  Testing/Procedures: NONE ordered at this time of appointment   Follow-Up: At [facility], you and your health needs are our priority.  As part of our continuing mission to provide you with exceptional heart care, we have created designated Provider Care Teams.  These Care Teams include your primary Cardiologist (physician) and Advanced Practice Providers (APPs -  Physician Assistants and Nurse Practitioners) who all work together to provide you with the care you need, when you need it.  Your next appointment:   3-4 month(s)  The format for your next appointment:   In Person  Provider:   Peter Martinique, MD  Other Instructions    Signed, Almyra Deforest, [place]  02/01/2021 8:57 PM    [place]

## 2021-01-30 NOTE — Patient Instructions (Signed)
Medication Instructions:  Your physician recommends that you continue on your current medications as directed. Please refer to the Current Medication list given to you today.  *If you need a refill on your cardiac medications before your next appointment, please call your pharmacy*  Lab Work: NONE ordered at this time of appointment   If you have labs (blood work) drawn today and your tests are completely normal, you will receive your results only by: [place] (if you have MyChart) OR A paper copy in the mail If you have any lab test that is abnormal or we need to change your treatment, we will call you to review the results.  Testing/Procedures: NONE ordered at this time of appointment   Follow-Up: At [facility], you and your health needs are our priority.  As part of our continuing mission to provide you with exceptional heart care, we have created designated Provider Care Teams.  These Care Teams include your primary Cardiologist (physician) and Advanced Practice Providers (APPs -  Physician Assistants and Nurse Practitioners) who all work together to provide you with the care you need, when you need it.  Your next appointment:   3-4 month(s)  The format for your next appointment:   In Person  Provider:   Peter Martinique, MD  Other Instructions

## 2021-02-01 ENCOUNTER — Encounter: Payer: Self-pay | Admitting: Physician Assistant

## 2021-03-07 ENCOUNTER — Emergency Department (HOSPITAL_COMMUNITY)
Admission: EM | Admit: 2021-03-07 | Discharge: 2021-03-08 | Disposition: A | Discharge status: Home / Self Care | Payer: 59 | Attending: Emergency Medicine | Admitting: Emergency Medicine

## 2021-03-07 ENCOUNTER — Emergency Department (HOSPITAL_COMMUNITY): Payer: 59

## 2021-03-07 ENCOUNTER — Other Ambulatory Visit: Payer: Self-pay

## 2021-03-07 DIAGNOSIS — Z87891 Personal history of nicotine dependence: Secondary | ICD-10-CM | POA: Insufficient documentation

## 2021-03-07 DIAGNOSIS — Z79899 Other long term (current) drug therapy: Secondary | ICD-10-CM | POA: Insufficient documentation

## 2021-03-07 DIAGNOSIS — I10 Essential (primary) hypertension: Secondary | ICD-10-CM | POA: Insufficient documentation

## 2021-03-07 DIAGNOSIS — S0532XA Ocular laceration without prolapse or loss of intraocular tissue, left eye, initial encounter: Secondary | ICD-10-CM | POA: Insufficient documentation

## 2021-03-07 DIAGNOSIS — W504XXA Accidental scratch by another person, initial encounter: Secondary | ICD-10-CM | POA: Diagnosis not present

## 2021-03-07 DIAGNOSIS — Z91040 Latex allergy status: Secondary | ICD-10-CM | POA: Insufficient documentation

## 2021-03-07 DIAGNOSIS — S0592XA Unspecified injury of left eye and orbit, initial encounter: Secondary | ICD-10-CM | POA: Diagnosis present

## 2021-03-07 LAB — CBC WITH DIFFERENTIAL/PLATELET
Abs Immature Granulocytes: 0.02 10*3/uL (ref 0.00–0.07)
Basophils Absolute: 0 10*3/uL (ref 0.0–0.1)
Basophils Relative: 1 %
Eosinophils Absolute: 0.1 10*3/uL (ref 0.0–0.5)
Eosinophils Relative: 2 %
HCT: 41.1 % (ref 36.0–46.0)
Hemoglobin: 13.9 g/dL (ref 12.0–15.0)
Immature Granulocytes: 0 %
Lymphocytes Relative: 38 %
Lymphs Abs: 2.5 10*3/uL (ref 0.7–4.0)
MCH: 30.4 pg (ref 26.0–34.0)
MCHC: 33.8 g/dL (ref 30.0–36.0)
MCV: 89.9 fL (ref 80.0–100.0)
Monocytes Absolute: 0.8 10*3/uL (ref 0.1–1.0)
Monocytes Relative: 11 %
Neutro Abs: 3.2 10*3/uL (ref 1.7–7.7)
Neutrophils Relative %: 48 %
Platelets: 295 10*3/uL (ref 150–400)
RBC: 4.57 MIL/uL (ref 3.87–5.11)
RDW: 12.7 % (ref 11.5–15.5)
WBC: 6.6 10*3/uL (ref 4.0–10.5)
nRBC: 0 % (ref 0.0–0.2)

## 2021-03-07 LAB — BASIC METABOLIC PANEL
Anion gap: 6 (ref 5–15)
BUN: 9 mg/dL (ref 6–20)
CO2: 26 mmol/L (ref 22–32)
Calcium: 9.8 mg/dL (ref 8.9–10.3)
Chloride: 106 mmol/L (ref 98–111)
Creatinine, Ser: 1.03 mg/dL — ABNORMAL HIGH (ref 0.44–1.00)
GFR, Estimated: >60 mL/min (ref >60)
Glucose, Bld: 83 mg/dL (ref 70–99)
Potassium: 3.8 mmol/L (ref 3.5–5.1)
Sodium: 138 mmol/L (ref 135–145)

## 2021-03-07 MED ORDER — LEVOFLOXACIN IN D5W 750 MG/150ML IV SOLN
750.0000 mg | INTRAVENOUS | Status: DC
  Administered 2021-03-08: 750 mg via INTRAVENOUS
  Filled 2021-03-07: qty 150

## 2021-03-07 MED ORDER — VANCOMYCIN HCL 1750 MG/350ML IV SOLN
1750.0000 mg | Freq: Once | INTRAVENOUS | Status: AC
  Administered 2021-03-07: 1750 mg via INTRAVENOUS
  Filled 2021-03-07: qty 350

## 2021-03-07 MED ORDER — VANCOMYCIN HCL IN DEXTROSE 1-5 GM/200ML-% IV SOLN: 1000.0000 mg | INTRAVENOUS | Status: DC

## 2021-03-07 NOTE — ED Notes (Signed)
No answer for triage.

## 2021-03-07 NOTE — ED Provider Notes (Signed)
Emergency Medicine Provider Triage Evaluation Note  Candace Hall , a 60 y.o. female  was evaluated in triage.  Pt complains of an injury, sent to the ED by Dr. Ardelia Mems. I personally spoke with on the telephone when Dr. Valetta Close sent her to the emergency department.  Patient with an open globe evaluated in his office today.  He discussed her case with ophthalmologist at [facility].  Patient to come to the ED for Noncon CT of the orbits, IV vancomycin and IV moxifloxacin.  Plan to discharge her home as she will go to the OR tomorrow morning with Dr. Valetta Close.  She has already been prescribed outpatient antibiotics.  Review of Systems  Positive: Eye pain Negative: Fevers, chills  Physical Exam  BP (!) 143/102 (BP Location: Right Arm)   Pulse 64   Temp 98.6 F (37 C) (Oral)   Resp 16   Ht 5\' 2"  (1.575 m)   Wt 86.2 kg   LMP 01/31/2014   SpO2 100%   BMI 34.75 kg/m  Gen:   Awake, no distress   Resp:  Normal effort  MSK:   Moves extremities without difficulty  Other:  Glasses and eye shield in place over the left eye.  RRR no M/R/G.  Medical Decision Making  Medically screening exam initiated at 7:16 PM.  Appropriate orders placed.  Candace Hall was informed that the remainder of the evaluation will be completed by another provider, this initial triage assessment does not replace that evaluation, and the importance of remaining in the ED until their evaluation is complete.  Strict instructions from Dr. Franne Grip that no one in the ED is to remove patient's sunglasses or her eye shield.  No eye examination is to be performed by ED provider.  IV antibiotics ordered per Dr. Romeo Apple recommendations, however this hospital does not carry IV moxifloxacin.  After extensive discussion with the pharmacist, will substitute IV Levaquin.    This chart was dictated using voice recognition software, Dragon. Despite the best efforts of this provider to proofread and correct errors, errors may  still occur which can change documentation meaning.    [name] 03/07/21 1919    [name], MD 03/08/21 [phone]

## 2021-03-07 NOTE — ED Provider Notes (Signed)
[facility] EMERGENCY DEPARTMENT Provider Note   CSN: [id number] Arrival date & time: 03/07/21  1713     History Chief Complaint  Patient presents with   Eye Injury    Candace Hall is a 60 y.o. female.  Patient with history of corneal transplant presents today with eye injury.  Patient states last night her significant other accidentally scratched her left eye with his fingernail.  Patient went to see ophthalmology Dr. Valetta Close earlier today for evaluation who suspected that she had a left-sided globe rupture and recommended she come to the emergency department for antibiotics and CT scan.  He request that she be discharged following infusion of IV antibiotics as he plans to operate on the eye in the morning.  Ophthalmologist Dr. Valetta Close spoke with attending Dr. Langston Masker, states that he was thoroughly evaluated [place] in his office earlier today and has placed shield and sunglasses on the patient.  He has given strict instructions that no one in the ED is to remove patient's sunglasses or her eye shield and that no eye examination is to be performed by ED provider.   The history is provided by the patient. No language interpreter was used.  Eye Injury Pertinent negatives include no headaches.      Past Medical History:  Diagnosis Date   Allergy    Anxiety    Cataract    Hypertension     Patient Active Problem List   Diagnosis Date Noted   SVT (supraventricular tachycardia) ([place]) 11/06/2017   Dizziness 12/31/2016   Transient elevated blood pressure 12/31/2016   Solitary kidney, acquired 12/02/2014   Menopausal and perimenopausal disorder 05/24/2014   Fibroid uterus 02/18/2014    Past Surgical History:  Procedure Laterality Date   BREAST CYST EXCISION     BREAST EXCISIONAL BIOPSY Right    benign   CESAREAN SECTION     CORNEAL TRANSPLANT     KIDNEY SURGERY     a. s/p left nephrectomy   TUBAL LIGATION       OB History     Gravida  4   Para   2   Term  2   Preterm      AB  2   Living  2      SAB      IAB  2   Ectopic      Multiple      Live Births              Family History  Problem Relation Age of Onset   Hypertension Mother    Stroke Mother 74   Hypertension Brother    Ulcers Father        "Bleeding"   Other Brother 81       Cardiac Arrest    Social History   Tobacco Use   Smoking status: Former    Types: Cigarettes    Quit date: 05/15/1987    Years since quitting: 33.8   Smokeless tobacco: Never  Vaping Use   Vaping Use: Never used  Substance Use Topics   Alcohol use: No   Drug use: No    Home Medications Prior to Admission medications   Medication Sig Start Date End Date Taking? Authorizing Provider  metoprolol succinate (TOPROL-XL) 50 MG 24 hr tablet Take 0.5 tablets (25 mg total) by mouth daily. 09/27/20   Martinique, Peter M, MD    Allergies    Latex and Penicillins  Review of Systems   Review  of Systems  Constitutional:  Negative for chills, fatigue and fever.  Eyes:  Positive for pain and visual disturbance.  Neurological:  Negative for syncope, speech difficulty and headaches.  Psychiatric/Behavioral:  Negative for confusion and decreased concentration.   All other systems reviewed and are negative.  Physical Exam Updated Vital Signs BP (!) 184/107   Pulse 76   Temp 98.5 F (36.9 C)   Resp 16   Ht 5\' 2"  (1.575 m)   Wt 86.2 kg   LMP 01/31/2014   SpO2 100%   BMI 34.75 kg/m   Physical Exam Vitals and nursing note reviewed.  Constitutional:      General: She is not in acute distress.    Appearance: Normal appearance. She is normal weight. She is not ill-appearing, toxic-appearing or diaphoretic.  HENT:     Head: Normocephalic and atraumatic.  Eyes:     Comments: Eyes was not evaluated per ophthalmology request.  Sunglasses and eye shield in place.  Cardiovascular:     Rate and Rhythm: Normal rate.  Pulmonary:     Effort: Pulmonary effort is normal. No  respiratory distress.  Musculoskeletal:        General: Normal range of motion.     Cervical back: Normal range of motion.  Skin:    General: Skin is warm and dry.  Neurological:     General: No focal deficit present.     Mental Status: She is alert.  Psychiatric:        Mood and Affect: Mood normal.        Behavior: Behavior normal.    ED Results / Procedures / Treatments   Labs (all labs ordered are listed, but only abnormal results are displayed) Labs Reviewed  BASIC METABOLIC PANEL - Abnormal; Notable for the following components:      Result Value   Creatinine, Ser 1.03 (*)    All other components within normal limits  CBC WITH DIFFERENTIAL/PLATELET    EKG None  Radiology CT Orbits Wo Contrast  Result Date: 03/07/2021 CLINICAL DATA:  Orbital trauma. EXAM: CT ORBITS WITHOUT CONTRAST TECHNIQUE: Multidetector CT imaging of the orbits was performed using the standard protocol without intravenous contrast. Multiplanar CT image reconstructions were also generated. COMPARISON:  Head CT dated 10/29/2017. FINDINGS: Orbits: There is irregularity of the posterior wall of the left globe. The left lens is not visualized. Findings concerning for a traumatic left globe injury or rupture. Ophthalmology consult is advised. No retro-orbital hematoma. Visible paranasal sinuses: There is opacification of the right ethmoid air cell and mild diffuse mucoperiosteal thickening of the paranasal sinuses. No air-fluid level. Soft tissues: Normal. Osseous: No acute fracture. Limited intracranial: No acute or significant finding. IMPRESSION: 1. Traumatic left globe injury/rupture. Ophthalmology consult is advised. 2. No acute fracture. Electronically Signed   By: Anner Crete M.D.   On: 03/07/2021 20:07    Procedures Procedures   Medications Ordered in ED Medications  vancomycin (VANCOREADY) IVPB 1750 mg/350 mL (1,750 mg Intravenous New Bag/Given 03/07/21 2249)  vancomycin (VANCOCIN) IVPB 1000  mg/200 mL premix (has no administration in time range)  levofloxacin (LEVAQUIN) IVPB 750 mg (has no administration in time range)    ED Course  I have reviewed the triage vital signs and the nursing notes.  Pertinent labs & imaging results that were available during my care of the patient were reviewed by me and considered in my medical decision making (see chart for details).    MDM Rules/Calculators/A&P  Patient seen today for evaluation of her left eye injury.  She was seen by Dr. Valetta Close with ophthalmology earlier today who sent her for IV antibiotics and a CT scan.  He has called several providers in the emergency department today to ensure that her eye was not evaluated by emergency department staff and that sunglasses and shield remained in place throughout her visit as he plans to take her to the OR tomorrow. Patient is afebrile, non-toxic appearing, and in no acute distress. She has no further needs at this time, will discharge home after antibiotics. She has had several conversations over the phone with Dr. Valetta Close and is understanding of plan. Discharged in stable condition.   Final Clinical Impression(s) / ED Diagnoses Final diagnoses:  Rupture of globe of eye following blunt trauma, left, initial encounter    Rx / DC Orders ED Discharge Orders     None     An After Visit Summary was printed and given to the patient.    Nestor Lewandowsky 03/08/21 2329    Wyvonnia Dusky, MD 03/09/21 [phone]

## 2021-03-07 NOTE — Progress Notes (Signed)
Pharmacy Antibiotic Note  Candace Hall is a 59 y.o. female admitted on 03/07/2021 with  globe injury .  Pharmacy has been consulted for vancomycin and levaquin dosing.  Plan: Vancomycin 1750mg  x1 then 1000mg  IV q24h (eAUC 533, Cr 1.03, Vd 0.5) Levaquin 750mg  IV q24h -Monitor renal function, clinical status, and antibiotic plan -Order vanc levels as necessary  Height: 5\' 2"  (157.5 cm) Weight: 86.2 kg (190 lb) IBW/kg (Calculated) : 50.1  Temp (24hrs), Avg:98.6 F (37 C), Min:98.5 F (36.9 C), Max:98.6 F (37 C)  Recent Labs  Lab 03/07/21 2013  WBC 6.6  CREATININE 1.03*    Estimated Creatinine Clearance: 60.6 mL/min (A) (by C-G formula based on SCr of 1.03 mg/dL (H)).    Allergies  Allergen Reactions   Latex Anaphylaxis   Penicillins Anaphylaxis    Has patient had a PCN reaction causing immediate rash, facial/tongue/throat swelling, SOB or lightheadedness with hypotension: yes Has patient had a PCN reaction causing severe rash involving mucus membranes or skin necrosis: no Has patient had a PCN reaction that required hospitalization: yes Has patient had a PCN reaction occurring within the last 10 years: no If all of the above answers are "NO", then may proceed with Cephalosporin use.     Antimicrobials this admission: Vanc 10/25 >>  Levaquin 10/25 >>   Dose adjustments this admission: N/A  Thank you for allowing pharmacy to be a part of this patient's care.  Joetta Manners, PharmD, [facility] Emergency Medicine Clinical Pharmacist ED RPh Phone: [place]: [phone]

## 2021-03-07 NOTE — ED Triage Notes (Signed)
Pt referred by ophthalmologist concerning injury to left eye. Will require CT scan and IV ABX with follow up surgery tomorrow.

## 2021-03-08 NOTE — Discharge Instructions (Signed)
You were seen today for evaluation of your left eye injury.  IV antibiotics were given as per your ophthalmologist request.  Please take continue to follow orders given by ophthalmologist Dr. Valetta Close, I believe he plans to see you in the operating room tomorrow.  Return if development of new or worsening symptoms.

## 2021-03-08 NOTE — ED Notes (Signed)
Patient verbalizes understanding of discharge instructions. Opportunity for questioning and answers were provided. Armband removed by staff, pt discharged from ED via wheelchair.  

## 2021-06-15 ENCOUNTER — Institutional Professional Consult (permissible substitution): Payer: 59 | Admitting: Pulmonary Disease

## 2021-06-22 ENCOUNTER — Telehealth: Payer: Self-pay | Admitting: Cardiology

## 2021-06-22 ENCOUNTER — Other Ambulatory Visit: Payer: Self-pay

## 2021-06-22 MED ORDER — METOPROLOL SUCCINATE ER 50 MG PO TB24: 25.0000 mg | ORAL_TABLET | Freq: Every day | ORAL | 3 refills | Status: DC

## 2021-06-22 NOTE — Telephone Encounter (Signed)
°*  STAT* If patient is at the pharmacy, call can be transferred to refill team.   1. Which medications need to be refilled? (please list name of each medication and dose if known) metoprolol succinate (TOPROL-XL) 50 MG 24 hr tablet  2. Which pharmacy/location (including street and city if local pharmacy) is medication to be sent to? [place] ([place]), [place] - [address]  3. Do they need a 30 day or 90 day supply? [place]

## 2021-06-22 NOTE — Progress Notes (Unsigned)
Cardiology Office Note:    Date:  06/22/2021   ID:  Candace Hall, DOB 1961-01-20, MRN [id number]  PCP:  Elinor Parkinson   [facility] Providers Cardiologist:  Chenita Ruda Martinique, MD     Referring MD: Sue Lush, PA-C   No chief complaint on file.   History of Present Illness:    Candace LECOMPTE is a 61 y.o. female with a hx of SVT, anxiety and hypertension.  She was seen by Dr. Lovena Le in 2019 and continued on low-dose Lopressor and encouraged to avoid caffeine.  She does have fatigue when she takes the beta-blocker during the day, it was recommended she take it at night.   She works as a Manufacturing systems engineer.    She was seen by Almyra Deforest PA-C on  09/27/2020 at which time she was having palpitation once every several weeks, this usually last about 5 minutes before going away.  During the episode, she would have point sweats and dizziness. An event monitor was placed.  Limited due to skin reaction to adhesive.   Out of the 16 hours of data, there is 1 auto trigger and for manual trigger event.  First auto triggered event is related to the baseline EKG obtained when the heart monitor was first placed.  First manual trigger appears to be accidental push about 6-minute after the heart monitor was placed on.  Three other manual events all appears to be sinus rhythm.  Talking with the patient today, she did have symptom of palpitation while on the heart monitor.  Given the reassuring monitor result, I recommended continue on the current dose of metoprolol.   Past Medical History:  Diagnosis Date   Allergy    Anxiety    Cataract    Hypertension     Past Surgical History:  Procedure Laterality Date   BREAST CYST EXCISION     BREAST EXCISIONAL BIOPSY Right    benign   CESAREAN SECTION     CORNEAL TRANSPLANT     KIDNEY SURGERY     a. s/p left nephrectomy   TUBAL LIGATION      Current Medications: No outpatient medications have been marked as taking for the 06/26/21 encounter  (Appointment) with Martinique, Li Fragoso M, MD.     Allergies:   Latex and Penicillins   Social History   Socioeconomic History   Marital status: Married    Spouse name: Not on file   Number of children: Not on file   Years of education: Not on file   Highest education level: Not on file  Occupational History   Not on file  Tobacco Use   Smoking status: Former    Types: Cigarettes    Quit date: 05/15/1987    Years since quitting: 34.1   Smokeless tobacco: Never  Vaping Use   Vaping Use: Never used  Substance and Sexual Activity   Alcohol use: No   Drug use: No   Sexual activity: Yes  Other Topics Concern   Not on file  Social History Narrative   Not on file   Social Determinants of Health   Financial Resource Strain: Not on file  Food Insecurity: Not on file  Transportation Needs: Not on file  Physical Activity: Not on file  Stress: Not on file  Social Connections: Not on file     Family History: The patient's family history includes Hypertension in her brother and mother; Other (age of onset: 46) in her brother; Stroke (age of  onset: 15) in her mother; Ulcers in her father.  ROS:   Please see the history of present illness.     All other systems reviewed and are negative.  EKGs/Labs/Other Studies Reviewed:    The following studies were reviewed today:  Event monitor 11/18/2020 Normal sinus rhythm. range 52-144. average HR 72. No arrhythmia noted  EKG:  EKG is not ordered today.    Recent Labs: 03/07/2021: BUN 9; Creatinine, Ser 1.03; Hemoglobin 13.9; Platelets 295; Potassium 3.8; Sodium 138  Recent Lipid Panel    Component Value Date/Time   CHOL 171 12/27/2016 1626   TRIG 79 12/27/2016 1626   HDL 53 12/27/2016 1626   CHOLHDL 3.2 12/27/2016 1626   LDLCALC 102 (H) 12/27/2016 1626     Risk Assessment/Calculations:           Physical Exam:    VS:  LMP 01/31/2014     Wt Readings from Last 3 Encounters:  03/07/21 190 lb (86.2 kg)  01/30/21 197 lb  (89.4 kg)  09/27/20 195 lb 9.6 oz (88.7 kg)     GEN:  Well nourished, well developed in no acute distress HEENT: Normal NECK: No JVD; No carotid bruits LYMPHATICS: No lymphadenopathy CARDIAC: RRR, no murmurs, rubs, gallops RESPIRATORY:  Clear to auscultation without rales, wheezing or rhonchi  ABDOMEN: Soft, non-tender, non-distended MUSCULOSKELETAL:  No edema; No deformity  SKIN: Warm and dry NEUROLOGIC:  Alert and oriented x 3 PSYCHIATRIC:  Normal affect   ASSESSMENT:    No diagnosis found.  PLAN:    In order of problems listed above:  Palpitation: Despite the presence of palpitation, recent heart monitor shows sinus rhythm without significant irregular rhythm.  Continue observation.  No further work-up needed at this time  Hypertension: Blood pressure well controlled on current therapy.        Medication Adjustments/Labs and Tests Ordered: Current medicines are reviewed at length with the patient today.  Concerns regarding medicines are outlined above.  No orders of the defined types were placed in this encounter.   No orders of the defined types were placed in this encounter.    There are no Patient Instructions on file for this visit.   Signed, Makyia Erxleben Martinique, MD  06/22/2021 7:28 AM    [facility]

## 2021-06-22 NOTE — Telephone Encounter (Signed)
Called patient to advised medication refilled. Left message.

## 2021-06-26 ENCOUNTER — Ambulatory Visit: Payer: 59 | Admitting: Cardiology

## 2021-07-07 ENCOUNTER — Institutional Professional Consult (permissible substitution): Payer: 59 | Admitting: Pulmonary Disease

## 2021-10-14 NOTE — Progress Notes (Signed)
Cardiology Office Note:    Date:  10/18/2021   ID:  Candace Hall, DOB 05-Sep-1960, MRN [id number]  PCP:  Elinor Parkinson   [facility] Providers Cardiologist:  Wendee Hata Martinique, MD     Referring MD: Sue Lush, PA-C   Chief Complaint  Patient presents with   Follow-up    3-4 months.   Shortness of Breath   Chest Pain    Pressure a few days ago.    History of Present Illness:    Candace Hall is a 61 y.o. female with a hx of SVT, anxiety and hypertension.  She was seen by Dr. Lovena Le in 2019 and continued on low-dose Lopressor and encouraged to avoid caffeine.  She does have fatigue when she takes the beta-blocker during the day, it was recommended she take it at night.  She works as a Manufacturing systems engineer.    Was seen by Almyra Deforest PA-C  on 09/27/2020 at which time she was having palpitation once every several weeks, this usually last about 5 minutes before going away.  During the episode, she would have point sweats and dizziness.  A 30-day heart monitor was recommeneded to assess the frequency of SVT and the check to see if her nighttime sweat and the dizziness could be related to arrhythmia.  Unfortunately she was unable to wear the full course due to irritation and rash due to allergic reaction to the skin patch.  Out of the 16 hours of data, there was no significant arrhythmia.   On follow up today she is doing well. Rare minor palpitations. Tolerating low dose Toprol well. 50 mg made her feel fatigued. She has been evaluated for pulmonary nodules. PET scan was negative.    Past Medical History:  Diagnosis Date   Allergy    Anxiety    Cataract    Hypertension     Past Surgical History:  Procedure Laterality Date   BREAST CYST EXCISION     BREAST EXCISIONAL BIOPSY Right    benign   CESAREAN SECTION     CORNEAL TRANSPLANT     KIDNEY SURGERY     a. s/p left nephrectomy   TUBAL LIGATION      Current Medications: Current Meds  Medication Sig    metoprolol succinate (TOPROL-XL) 50 MG 24 hr tablet Take 0.5 tablets (25 mg total) by mouth daily.     Allergies:   Latex and Penicillins   Social History   Socioeconomic History   Marital status: Married    Spouse name: Not on file   Number of children: Not on file   Years of education: Not on file   Highest education level: Not on file  Occupational History   Not on file  Tobacco Use   Smoking status: Former    Types: Cigarettes    Quit date: 05/15/1987    Years since quitting: 34.4   Smokeless tobacco: Never  Vaping Use   Vaping Use: Never used  Substance and Sexual Activity   Alcohol use: No   Drug use: No   Sexual activity: Yes  Other Topics Concern   Not on file  Social History Narrative   Not on file   Social Determinants of Health   Financial Resource Strain: Not on file  Food Insecurity: Not on file  Transportation Needs: Not on file  Physical Activity: Not on file  Stress: Not on file  Social Connections: Not on file     Family History: The  patient's family history includes Hypertension in her brother and mother; Other (age of onset: 68) in her brother; Stroke (age of onset: 62) in her mother; Ulcers in her father.  ROS:   Please see the history of present illness.     All other systems reviewed and are negative.  EKGs/Labs/Other Studies Reviewed:    The following studies were reviewed today:  Event monitor 11/18/2020 Normal sinus rhythm. range 52-144. average HR 72. No arrhythmia noted  EKG:  EKG is not ordered today.    Recent Labs: 03/07/2021: BUN 9; Creatinine, Ser 1.03; Hemoglobin 13.9; Platelets 295; Potassium 3.8; Sodium 138  Recent Lipid Panel    Component Value Date/Time   CHOL 171 12/27/2016 1626   TRIG 79 12/27/2016 1626   HDL 53 12/27/2016 1626   CHOLHDL 3.2 12/27/2016 1626   LDLCALC 102 (H) 12/27/2016 1626     Risk Assessment/Calculations:           Physical Exam:    VS:  BP 122/82 (BP Location: Left Arm, Patient  Position: Sitting, Cuff Size: Large)   Pulse 68   Ht '5\' 3"'$  (1.6 m)   Wt 204 lb (92.5 kg)   LMP 01/31/2014   BMI 36.14 kg/m     Wt Readings from Last 3 Encounters:  10/18/21 204 lb (92.5 kg)  03/07/21 190 lb (86.2 kg)  01/30/21 197 lb (89.4 kg)     GEN:  Well nourished, well developed in no acute distress HEENT: Normal NECK: No JVD; No carotid bruits LYMPHATICS: No lymphadenopathy CARDIAC: RRR, no murmurs, rubs, gallops RESPIRATORY:  Clear to auscultation without rales, wheezing or rhonchi  ABDOMEN: Soft, non-tender, non-distended MUSCULOSKELETAL:  No edema; No deformity  SKIN: Warm and dry NEUROLOGIC:  Alert and oriented x 3 PSYCHIATRIC:  Normal affect   ASSESSMENT:    1. Palpitations   2. Primary hypertension     PLAN:    In order of problems listed above:  Palpitation: Well controlled on low dose Toprol. Continue same  Hypertension: Blood pressure well controlled on current therapy.     Follow up in one year   Medication Adjustments/Labs and Tests Ordered: Current medicines are reviewed at length with the patient today.  Concerns regarding medicines are outlined above.  No orders of the defined types were placed in this encounter.   No orders of the defined types were placed in this encounter.    There are no Patient Instructions on file for this visit.   Signed, Deagen Krass Martinique, MD  10/18/2021 8:05 AM    [facility]

## 2021-10-18 ENCOUNTER — Ambulatory Visit (INDEPENDENT_AMBULATORY_CARE_PROVIDER_SITE_OTHER): Payer: 59 | Admitting: Cardiology

## 2021-10-18 ENCOUNTER — Encounter: Payer: Self-pay | Admitting: Cardiology

## 2021-10-18 VITALS — BP 122/82 | HR 68 | Ht 63.0 in | Wt 204.0 lb

## 2021-10-18 DIAGNOSIS — I10 Essential (primary) hypertension: Secondary | ICD-10-CM

## 2021-10-18 DIAGNOSIS — R002 Palpitations: Secondary | ICD-10-CM

## 2021-10-18 NOTE — Patient Instructions (Signed)
Medication Instructions:  Your physician recommends that you continue on your current medications as directed. Please refer to the Current Medication list given to you today.  *If you need a refill on your cardiac medications before your next appointment, please call your pharmacy*   Follow-Up: At [facility], you and your health needs are our priority.  As part of our continuing mission to provide you with exceptional heart care, we have created designated Provider Care Teams.  These Care Teams include your primary Cardiologist (physician) and Advanced Practice Providers (APPs -  Physician Assistants and Nurse Practitioners) who all work together to provide you with the care you need, when you need it.  We recommend signing up for the patient portal called "MyChart".  Sign up information is provided on this After Visit Summary.  MyChart is used to connect with patients for Virtual Visits (Telemedicine).  Patients are able to view lab/test results, encounter notes, upcoming appointments, etc.  Non-urgent messages can be sent to your provider as well.   To learn more about what you can do with MyChart, go to [email or web address].    Your next appointment:   12 month(s)  The format for your next appointment:   In Person  Provider:   Peter Martinique, MD {

## 2022-06-08 ENCOUNTER — Other Ambulatory Visit: Payer: Self-pay | Admitting: Cardiology

## 2022-11-01 ENCOUNTER — Ambulatory Visit: Payer: 59 | Attending: Physician Assistant | Admitting: Physician Assistant

## 2022-11-01 ENCOUNTER — Encounter: Payer: Self-pay | Admitting: Physician Assistant

## 2022-11-01 VITALS — BP 132/80 | HR 67 | Ht 62.0 in | Wt 192.2 lb

## 2022-11-01 DIAGNOSIS — R002 Palpitations: Secondary | ICD-10-CM | POA: Diagnosis not present

## 2022-11-01 NOTE — Progress Notes (Signed)
Cardiology Office Note:  .   Date:  11/03/2022  ID:  Candace Hall, DOB [birth year], MRN [id number] PCP: Leonard Downing  [facility] Providers Cardiologist:  Peter Swaziland, MD     History of Present Illness: .   Candace Hall is a 62 y.o. female with PMH of SVT, anxiety and hypertension.  She was seen by Dr. Ladona Ridgel in 2019 and continued on low-dose Lopressor and encouraged to avoid caffeine.  She does have fatigue when she takes the beta-blocker during the day, it was recommended she take it at night.  I previously saw the patient in 2022, she was complaining of increased palpitation along with dizziness.  I recommended a 30-day heart monitor to assess for SVT burden.  She was unable to wear the full course due to irritation and rash secondary to allergic reaction from the skin patch.  Out of the 16 hours of data, there was 1 auto triggered and aforementioned triggered events.  All manual triggered events appears to be sinus rhythm.  She was last seen by Dr. Swaziland in June 2023 at which time she was doing well.  Patient is today for follow-up.  She only has rare episode of palpitation once every few months.  She current dose of Toprol-XL 25 mg daily seems to be controlling her symptoms quite well.  She described occasional episodes where she will wake up in the middle of the night feeling jolted and have chest pressure.  His symptom would last somewhere around 45 minutes before settle down.  This is also quite rare and occurs once every several months.  I recommended continue observation since symptom does not occur when she exert herself during the day working in [employer].  Since last week, she has had left hip pain.  Location is lateral to the left hip.  Symptom is a continuous pain that is getting better.  I suspect this is likely bursitis.  If this symptom resolved by itself, I would not recommend any further workup.  I was unable to feel her dorsalis pedis pulse, but her  foot feels warm, suspicion for major vascular issue is fairly low.  ROS:   He denies chest pain, palpitations, dyspnea, pnd, orthopnea, n, v, dizziness, syncope, edema, weight gain, or early satiety. All other systems reviewed and are otherwise negative except as noted above.    Studies Reviewed: Marland Kitchen   EKG Interpretation  Date/Time:  Thursday November 01 2022 08:07:19 EDT Ventricular Rate:  59 PR Interval:  178 QRS Duration: 90 QT Interval:  394 QTC Calculation: 390 R Axis:   44 Text Interpretation: Sinus bradycardia  No significant ST-T wave changes Confirmed by Azalee Course [phone]) on 11/01/2022 8:36:51 AM    Cardiac Studies & Procedures     STRESS TESTS  EXERCISE TOLERANCE TEST (ETT) 01/03/2017  Narrative  Blood pressure demonstrated a hypertensive response to exercise.  There was no ST segment deviation noted during stress.  1. Below average exercise tolerance. 2. No evidence for ischemia by ST segment analysis. 3. Hypertensive BP response.   ECHOCARDIOGRAM  ECHOCARDIOGRAM COMPLETE 11/01/2017  Narrative *[facility]* [address] [place], [place] [address] [phone]  ------------------------------------------------------------------- Transthoracic Echocardiography  Patient:    Candace Hall MR #:       [id number] Study Date: 11/01/2017 Gender:     F Age:        56 Height:     157.5 cm Weight:     83.9 kg  BSA:        1.95 m^2 Pt. Status: Room:  ATTENDING    Peter Swaziland, M.D. ORDERING     Peter Swaziland, M.D. REFERRING    Peter Swaziland, M.D. SONOGRAPHER  Dewitt Hoes, RDCS PERFORMING   [facility], Outpatient  cc:  ------------------------------------------------------------------- LV EF: 55% -   60%  ------------------------------------------------------------------- Indications:      Palpitation (R00.2).  ------------------------------------------------------------------- History:   PMH:  No prior cardiac history.  Risk factors:   Former tobacco use.  ------------------------------------------------------------------- Study Conclusions  - Left ventricle: The cavity size was normal. There was mild focal basal hypertrophy of the septum. Systolic function was normal. The estimated ejection fraction was in the range of 55% to 60%. Wall motion was normal; there were no regional wall motion abnormalities. Features are consistent with a pseudonormal left ventricular filling pattern, with concomitant abnormal relaxation and increased filling pressure (grade 2 diastolic dysfunction). - Aortic valve: Transvalvular velocity was within the normal range. There was no stenosis. There was no regurgitation. - Mitral valve: Transvalvular velocity was within the normal range. There was no evidence for stenosis. - Right ventricle: The cavity size was normal. Wall thickness was normal. Systolic function was normal. - Atrial septum: No defect or patent foramen ovale was identified by color flow Doppler. - Tricuspid valve: There was trivial regurgitation. - Pulmonary arteries: Systolic pressure was within the normal range. PA peak pressure: 17 mm Hg (S).  ------------------------------------------------------------------- Study data:  No prior study was available for comparison.  Study status:  Routine.  Procedure:  The patient reported no pain pre or post test. Transthoracic echocardiography. Image quality was adequate.  Study completion:  There were no complications. Transthoracic echocardiography.  M-mode, complete 2D, 3D, spectral Doppler, and color Doppler.  Birthdate:  Patient birthdate: [birth year].  Age:  Patient is 62 yr old.  Sex:  Gender: female. BMI: 33.8 kg/m^2.  Blood pressure:     126/94  Patient status: Outpatient.  Study date:  Study date: 11/01/2017. Study time: 09:15 AM.  Location:  [facility]  -------------------------------------------------------------------  ------------------------------------------------------------------- Left ventricle:  The cavity size was normal. There was mild focal basal hypertrophy of the septum. Systolic function was normal. The estimated ejection fraction was in the range of 55% to 60%. Wall motion was normal; there were no regional wall motion abnormalities. Features are consistent with a pseudonormal left ventricular filling pattern, with concomitant abnormal relaxation and increased filling pressure (grade 2 diastolic dysfunction).  ------------------------------------------------------------------- Aortic valve:   Trileaflet; normal thickness leaflets. Mobility was not restricted.  Doppler:  Transvalvular velocity was within the normal range. There was no stenosis. There was no regurgitation.  ------------------------------------------------------------------- Aorta:  Aortic root: The aortic root was normal in size.  ------------------------------------------------------------------- Mitral valve:   Structurally normal valve.   Mobility was not restricted.  Doppler:  Transvalvular velocity was within the normal range. There was no evidence for stenosis. There was trivial regurgitation.    Valve area by pressure half-time: 4.15 cm^2. Indexed valve area by pressure half-time: 2.13 cm^2/m^2.    Peak gradient (D): 3 mm Hg.  ------------------------------------------------------------------- Left atrium:  The atrium was normal in size.  ------------------------------------------------------------------- Atrial septum:  No defect or patent foramen ovale was identified by color flow Doppler.  ------------------------------------------------------------------- Right ventricle:  The cavity size was normal. Wall thickness was normal. Systolic function was  normal.  ------------------------------------------------------------------- Pulmonic valve:    Structurally normal valve.   Cusp separation was normal.  Doppler:  Transvalvular velocity was  within the normal range. There was no evidence for stenosis. There was no regurgitation.  ------------------------------------------------------------------- Tricuspid valve:   Structurally normal valve.    Doppler: Transvalvular velocity was within the normal range. There was trivial regurgitation.  ------------------------------------------------------------------- Pulmonary artery:   The main pulmonary artery was normal-sized. Systolic pressure was within the normal range.  ------------------------------------------------------------------- Right atrium:  The atrium was normal in size.  ------------------------------------------------------------------- Pericardium:  There was no pericardial effusion.  ------------------------------------------------------------------- Systemic veins: Inferior vena cava: The vessel was normal in size. The respirophasic diameter changes were in the normal range (>= 50%), consistent with normal central venous pressure.  ------------------------------------------------------------------- Measurements  Left ventricle                           Value           Reference LV ID, ED, PLAX chordal          (L)     40     mm       43 - 52 LV ID, ES, PLAX chordal                  28     mm       23 - 38 LV fx shortening, PLAX chordal           30     %        >=29 LV PW thickness, ED                      9      mm       --------- IVS/LV PW ratio, ED              (H)     1.33            <=1.3 Stroke volume, 2D                        80     ml       --------- Stroke volume/bsa, 2D                    41     ml/m^2   --------- LV e&', lateral                           8.93   cm/s     --------- LV E/e&', lateral                         9.03            --------- LV  e&', medial                            5.63   cm/s     --------- LV E/e&', medial                          14.32           --------- LV e&', average                           7.28   cm/s     --------- LV E/e&', average  11.07           ---------  Ventricular septum                       Value           Reference IVS thickness, ED                        12     mm       ---------  LVOT                                     Value           Reference LVOT ID, S                               20     mm       --------- LVOT area                                3.14   cm^2     --------- LVOT peak velocity, S                    105    cm/s     --------- LVOT mean velocity, S                    75.6   cm/s     --------- LVOT VTI, S                              25.5   cm       ---------  Aorta                                    Value           Reference Aortic root ID, ED                       31     mm       --------- Ascending aorta ID, A-P, S               31     mm       ---------  Left atrium                              Value           Reference LA ID, A-P, ES                           27     mm       --------- LA ID/bsa, A-P                           1.38   cm/m^2   <=2.2 LA volume, S  29.3   ml       --------- LA volume/bsa, S                         15     ml/m^2   --------- LA volume, ES, 1-p A4C                   32.9   ml       --------- LA volume/bsa, ES, 1-p A4C               16.8   ml/m^2   --------- LA volume, ES, 1-p A2C                   22.5   ml       --------- LA volume/bsa, ES, 1-p A2C               11.5   ml/m^2   ---------  Mitral valve                             Value           Reference Mitral E-wave peak velocity              80.6   cm/s     --------- Mitral A-wave peak velocity              63.1   cm/s     --------- Mitral deceleration time                 180    ms       150 - 230 Mitral pressure half-time                 53     ms       --------- Mitral peak gradient, D                  3      mm Hg    --------- Mitral E/A ratio, peak                   1.3             --------- Mitral valve area, PHT, DP               4.15   cm^2     --------- Mitral valve area/bsa, PHT, DP           2.13   cm^2/m^2 ---------  Pulmonary arteries                       Value           Reference PA pressure, S, DP                       17     mm Hg    <=30  Tricuspid valve                          Value           Reference Tricuspid regurg peak velocity           185.69 cm/s     --------- Tricuspid peak RV-RA gradient            14     mm  Hg    --------- Tricuspid maximal regurg                 185.69 cm/s     --------- velocity, PISA  Systemic veins                           Value           Reference Estimated CVP                            3      mm Hg    ---------  Right ventricle                          Value           Reference TAPSE                                    18.8   mm       --------- RV pressure, S, DP                       17     mm Hg    <=30 RV s&', lateral, S                        8.73   cm/s     ---------  Legend: (L)  and  (H)  mark values outside specified reference range.  ------------------------------------------------------------------- Prepared and Electronically Authenticated by  Chilton Si, MD 2019-06-21T13:31:26    MONITORS  CARDIAC EVENT MONITOR 11/18/2020  Narrative  Normal sinus rhythm. range 52-144. average HR 72.  No arrhythmia noted           Risk Assessment/Calculations:            Physical Exam:   VS:  BP 132/80 (BP Location: Right Arm, Patient Position: Sitting, Cuff Size: Large)   Pulse 67   Ht 5\' 2"  (1.575 m)   Wt 192 lb 3.2 oz (87.2 kg)   LMP 01/31/2014   SpO2 92%   BMI 35.15 kg/m    Wt Readings from Last 3 Encounters:  11/01/22 192 lb 3.2 oz (87.2 kg)  10/18/21 204 lb (92.5 kg)  03/07/21 190 lb (86.2 kg)    GEN: Well nourished, well  developed in no acute distress NECK: No JVD; No carotid bruits CARDIAC: RRR, no murmurs, rubs, gallops RESPIRATORY:  Clear to auscultation without rales, wheezing or rhonchi  ABDOMEN: Soft, non-tender, non-distended EXTREMITIES:  No edema; No deformity   ASSESSMENT AND PLAN: .    Palpitation: History of SVT.  Appears to be very well-controlled on metoprolol succinate.  She does have occasional breakthrough once every several months.  Will continue on the current therapy        Dispo: 36-month follow-up.  Signed, Azalee Course, PA

## 2022-11-01 NOTE — Patient Instructions (Signed)
Medication Instructions:   Your physician recommends that you continue on your current medications as directed. Please refer to the Current Medication list given to you today.  *If you need a refill on your cardiac medications before your next appointment, please call your pharmacy*  Lab Work: NONE ordered at this time of appointment   If you have labs (blood work) drawn today and your tests are completely normal, you will receive your results only by: MyChart Message (if you have MyChart) OR A paper copy in the mail If you have any lab test that is abnormal or we need to change your treatment, we will call you to review the results.  Testing/Procedures: NONE ordered at this time of appointment   Follow-Up: At [facility], you and your health needs are our priority.  As part of our continuing mission to provide you with exceptional heart care, we have created designated Provider Care Teams.  These Care Teams include your primary Cardiologist (physician) and Advanced Practice Providers (APPs -  Physician Assistants and Nurse Practitioners) who all work together to provide you with the care you need, when you need it.   Your next appointment:   12 month(s)  Provider:   Peter Swaziland, MD     Other Instructions

## 2023-07-02 ENCOUNTER — Other Ambulatory Visit: Payer: Self-pay | Admitting: Cardiology

## 2023-11-26 ENCOUNTER — Other Ambulatory Visit: Payer: Self-pay | Admitting: Cardiology

## 2023-12-08 ENCOUNTER — Other Ambulatory Visit: Payer: Self-pay | Admitting: Cardiology

## 2024-01-15 ENCOUNTER — Telehealth: Payer: Self-pay | Admitting: Cardiology

## 2024-01-15 MED ORDER — METOPROLOL SUCCINATE ER 50 MG PO TB24: 25.0000 mg | ORAL_TABLET | Freq: Every day | ORAL | 0 refills | Status: DC

## 2024-01-15 NOTE — Telephone Encounter (Signed)
*  STAT* If patient is at the pharmacy, call can be transferred to refill team.   1. Which medications need to be refilled? (please list name of each medication and dose if known)   metoprolol  succinate (TOPROL -XL) 50 MG 24 hr tablet    4. Which pharmacy/location (including street and city if local pharmacy) is medication to be sent to?  [facility] - [place] ([place]), [place] - [address]     5. Do they need a 30 day or 90 day supply? 90    Pt scheduled 10/22

## 2024-01-15 NOTE — Telephone Encounter (Signed)
 RX sent in

## 2024-02-29 NOTE — Progress Notes (Unsigned)
 Cardiology Office Note:    Date:  03/04/2024   ID:  [name], DOB 05/18/1960, MRN [id number]  PCP:  Hall, Jubal Rademaker M, MD   [facility] Providers Cardiologist:  Shenelle Klas Swaziland, MD     Referring MD: Emilio Joesph DEL, PA-C   Chief Complaint  Patient presents with   Palpitations    History of Present Illness:    Candace Hall is a 63 y.o. female with a hx of SVT, anxiety and hypertension.  She was seen by Dr. Waddell in 2019 and continued on low-dose Lopressor  and encouraged to avoid caffeine.  She does have fatigue when she takes the beta-blocker during the day, it was recommended she take it at night.  She works as a Management consultant.    Was seen by Scot Ford PA-C  on 09/27/2020 at which time she was having palpitation once every several weeks, this usually last about 5 minutes before going away.  During the episode, she would have point sweats and dizziness.  A 30-day heart monitor was recommeneded to assess the frequency of SVT and the check to see if her nighttime sweat and the dizziness could be related to arrhythmia.  Unfortunately she was unable to wear the full course due to irritation and rash due to allergic reaction to the skin patch.  Out of the 16 hours of data, there was no significant arrhythmia.   On follow up today she is doing well. Rare minor palpitations. She had some off and on for 30 minutes yesterday but this was the first time in a long time. She has been evaluated for pulmonary nodules. PET scan was negative.    Past Medical History:  Diagnosis Date   Allergy    Anxiety    Cataract    Hypertension     Past Surgical History:  Procedure Laterality Date   BREAST CYST EXCISION     BREAST EXCISIONAL BIOPSY Right    benign   CESAREAN SECTION     CORNEAL TRANSPLANT     KIDNEY SURGERY     a. s/p left nephrectomy   TUBAL LIGATION      Current Medications: Current Meds  Medication Sig   [DISCONTINUED] metoprolol  succinate (TOPROL -XL) 50 MG 24  hr tablet Take 0.5 tablets (25 mg total) by mouth daily.     Allergies:   Latex and Penicillins   Social History   Socioeconomic History   Marital status: Married    Spouse name: Not on file   Number of children: Not on file   Years of education: Not on file   Highest education level: Not on file  Occupational History   Not on file  Tobacco Use   Smoking status: Former    Current packs/day: 0.00    Types: Cigarettes    Quit date: 05/15/1987    Years since quitting: 36.8   Smokeless tobacco: Never  Vaping Use   Vaping status: Never Used  Substance and Sexual Activity   Alcohol use: No   Drug use: No   Sexual activity: Yes  Other Topics Concern   Not on file  Social History Narrative   Not on file   Social Drivers of Health   Financial Resource Strain: Low Risk  (02/23/2021)   Received from [facility]   Overall Financial Resource Strain (CARDIA)    Difficulty of Paying Living Expenses: Not hard at all  Food Insecurity: No Food Insecurity (06/09/2021)   [facility] from [facility]   Hunger  Vital Sign    Within the past 12 months, you worried that your food would run out before you got the money to buy more.: Never true    Within the past 12 months, the food you bought just didn't last and you didn't have money to get more.: Never true  Transportation Needs: No Transportation Needs (02/23/2021)   Received from [facility]   PRAPARE - Transportation    Lack of Transportation (Medical): No    Lack of Transportation (Non-Medical): No  Physical Activity: Not on file  Stress: No Stress Concern Present (02/23/2021)   Received from [facility] - Occupational Stress Questionnaire    Feeling of Stress : Only a little  Social Connections: Unknown (01/04/2023)   Received from [facility]   Social Network    Social Network: Not on file     Family History: The patient's family history includes Hypertension in her brother and  mother; Other (age of onset: 53) in her brother; Stroke (age of onset: 61) in her mother; Ulcers in her father.  ROS:   Please see the history of present illness.     All other systems reviewed and are negative.  EKGs/Labs/Other Studies Reviewed:    The following studies were reviewed today:  Event monitor 11/18/2020 Normal sinus rhythm. range 52-144. average HR 72. No arrhythmia noted  EKG Interpretation Date/Time:  Wednesday March 04 2024 15:10:41 EDT Ventricular Rate:  60 PR Interval:  198 QRS Duration:  92 QT Interval:  406 QTC Calculation: 406 R Axis:   47  Text Interpretation: Normal sinus rhythm Nonspecific T wave abnormality When compared with ECG of 01-Nov-2022 08:07, No significant change was found Confirmed by Hall, Mickenzie Stolar [phone]) on 03/04/2024 3:17:10 PM    Recent Labs: No results found for requested labs within last 365 days.  Recent Lipid Panel    Component Value Date/Time   CHOL 171 12/27/2016 1626   TRIG 79 12/27/2016 1626   HDL 53 12/27/2016 1626   CHOLHDL 3.2 12/27/2016 1626   LDLCALC 102 (H) 12/27/2016 1626     Risk Assessment/Calculations:           Physical Exam:    VS:  BP 130/80 (Cuff Size: Large)   Pulse 62   Ht 5' 2 (1.575 m)   Wt 197 lb 2 oz (89.4 kg)   LMP 01/31/2014   SpO2 100%   BMI 36.05 kg/m     Wt Readings from Last 3 Encounters:  03/04/24 197 lb 2 oz (89.4 kg)  11/01/22 192 lb 3.2 oz (87.2 kg)  10/18/21 204 lb (92.5 kg)     GEN:  Well nourished, well developed in no acute distress HEENT: Normal NECK: No JVD; No carotid bruits LYMPHATICS: No lymphadenopathy CARDIAC: RRR, no murmurs, rubs, gallops RESPIRATORY:  Clear to auscultation without rales, wheezing or rhonchi  ABDOMEN: Soft, non-tender, non-distended MUSCULOSKELETAL:  No edema; No deformity  SKIN: Warm and dry NEUROLOGIC:  Alert and oriented x 3 PSYCHIATRIC:  Normal affect   ASSESSMENT:    1. Palpitations   2. SVT (supraventricular tachycardia)       PLAN:    In order of problems listed above:  Palpitation: Well controlled on low dose Toprol . Continue same- Rx refilled.   Hypertension: Blood pressure well controlled on current therapy.     Follow up in one year   Medication Adjustments/Labs and Tests Ordered: Current medicines are reviewed at length with the patient today.  Concerns regarding medicines are outlined above.  Orders Placed This Encounter  Procedures   EKG 12-Lead    Meds ordered this encounter  Medications   metoprolol  succinate (TOPROL -XL) 50 MG 24 hr tablet    Sig: Take 0.5 tablets (25 mg total) by mouth daily.    Dispense:  45 tablet    Refill:  3    Pt must keep upcoming followup appt with Cardiology in October 2025 for any more refills. Thank You     Patient Instructions  Medication Instructions:  Continue same medications *If you need a refill on your cardiac medications before your next appointment, please call your pharmacy*  Lab Work: None ordered  Testing/Procedures: None ordered  Follow-Up: At [facility], you and your health needs are our priority.  As part of our continuing mission to provide you with exceptional heart care, our providers are all part of one team.  This team includes your primary Cardiologist (physician) and Advanced Practice Providers or APPs (Physician Assistants and Nurse Practitioners) who all work together to provide you with the care you need, when you need it.  Your next appointment:  1 year   Call in May to schedule Oct appointment     Provider:  Dr.Isais Klipfel   We recommend signing up for the patient portal called MyChart.  Sign up information is provided on this After Visit Summary.  MyChart is used to connect with patients for Virtual Visits (Telemedicine).  Patients are able to view lab/test results, encounter notes, upcoming appointments, etc.  Non-urgent messages can be sent to your provider as well.   To learn more about what you can do  with MyChart, go to [email or web address].          Signed, Candace Thorp Sw[place], MD  03/04/2024 3:22 PM    [facility]

## 2024-03-04 ENCOUNTER — Ambulatory Visit: Attending: Cardiology | Admitting: Cardiology

## 2024-03-04 ENCOUNTER — Encounter: Payer: Self-pay | Admitting: Cardiology

## 2024-03-04 VITALS — BP 130/80 | HR 62 | Ht 62.0 in | Wt 197.1 lb

## 2024-03-04 DIAGNOSIS — R002 Palpitations: Secondary | ICD-10-CM | POA: Diagnosis not present

## 2024-03-04 DIAGNOSIS — I471 Supraventricular tachycardia, unspecified: Secondary | ICD-10-CM | POA: Diagnosis not present

## 2024-03-04 MED ORDER — METOPROLOL SUCCINATE ER 50 MG PO TB24: 25.0000 mg | ORAL_TABLET | Freq: Every day | ORAL | 3 refills | Status: AC

## 2024-03-04 NOTE — Patient Instructions (Addendum)
